# Patient Record
Sex: Female | Born: 1980 | Race: White | Hispanic: No | Marital: Married | Smoking: Never smoker
Health system: Southern US, Community
[De-identification: ages and names within clinical notes are randomized; demographics above are authoritative.]

## PROBLEM LIST (undated history)

## (undated) DIAGNOSIS — K219 Gastro-esophageal reflux disease without esophagitis: Secondary | ICD-10-CM

## (undated) DIAGNOSIS — E785 Hyperlipidemia, unspecified: Secondary | ICD-10-CM

## (undated) DIAGNOSIS — G43909 Migraine, unspecified, not intractable, without status migrainosus: Secondary | ICD-10-CM

## (undated) DIAGNOSIS — Z8739 Personal history of other diseases of the musculoskeletal system and connective tissue: Secondary | ICD-10-CM

## (undated) DIAGNOSIS — E039 Hypothyroidism, unspecified: Secondary | ICD-10-CM

## (undated) HISTORY — DX: Hypothyroidism, unspecified: E03.9

## (undated) HISTORY — DX: Migraine, unspecified, not intractable, without status migrainosus: G43.909

## (undated) HISTORY — DX: Hyperlipidemia, unspecified: E78.5

## (undated) HISTORY — PX: TONSILLECTOMY: SUR1361

## (undated) HISTORY — DX: Personal history of other diseases of the musculoskeletal system and connective tissue: Z87.39

## (undated) HISTORY — DX: Gastro-esophageal reflux disease without esophagitis: K21.9

---

## 2005-11-18 DIAGNOSIS — E039 Hypothyroidism, unspecified: Secondary | ICD-10-CM

## 2005-11-18 HISTORY — DX: Hypothyroidism, unspecified: E03.9

## 2008-01-28 ENCOUNTER — Emergency Department (HOSPITAL_COMMUNITY): Admission: EM | Admit: 2008-01-28 | Discharge: 2008-01-28 | Payer: Self-pay | Admitting: Emergency Medicine

## 2011-08-12 LAB — BASIC METABOLIC PANEL
BUN: 12
CO2: 26
Chloride: 105
Creatinine, Ser: 0.69
Glucose, Bld: 91
Potassium: 4.1

## 2011-08-12 LAB — T4: T4, Total: 13.8 — ABNORMAL HIGH

## 2018-10-30 ENCOUNTER — Ambulatory Visit: Payer: BC Managed Care – PPO | Admitting: Family Medicine

## 2018-10-30 ENCOUNTER — Encounter: Payer: Self-pay | Admitting: Family Medicine

## 2018-10-30 VITALS — BP 104/76 | HR 62 | Temp 98.2°F | Ht 64.0 in | Wt 179.0 lb

## 2018-10-30 DIAGNOSIS — Z8669 Personal history of other diseases of the nervous system and sense organs: Secondary | ICD-10-CM

## 2018-10-30 DIAGNOSIS — Z131 Encounter for screening for diabetes mellitus: Secondary | ICD-10-CM | POA: Diagnosis not present

## 2018-10-30 DIAGNOSIS — E785 Hyperlipidemia, unspecified: Secondary | ICD-10-CM | POA: Diagnosis not present

## 2018-10-30 DIAGNOSIS — E039 Hypothyroidism, unspecified: Secondary | ICD-10-CM

## 2018-10-30 DIAGNOSIS — K219 Gastro-esophageal reflux disease without esophagitis: Secondary | ICD-10-CM

## 2018-10-30 DIAGNOSIS — Z0000 Encounter for general adult medical examination without abnormal findings: Secondary | ICD-10-CM | POA: Diagnosis not present

## 2018-10-30 DIAGNOSIS — M349 Systemic sclerosis, unspecified: Secondary | ICD-10-CM

## 2018-10-30 LAB — COMPREHENSIVE METABOLIC PANEL
ALT: 12 U/L (ref 0–35)
AST: 14 U/L (ref 0–37)
Albumin: 4.6 g/dL (ref 3.5–5.2)
Alkaline Phosphatase: 40 U/L (ref 39–117)
BUN: 10 mg/dL (ref 6–23)
CO2: 25 mEq/L (ref 19–32)
Calcium: 9.5 mg/dL (ref 8.4–10.5)
Chloride: 104 mEq/L (ref 96–112)
Creatinine, Ser: 0.72 mg/dL (ref 0.40–1.20)
GFR: 97.79 mL/min (ref >60.00)
GLUCOSE: 83 mg/dL (ref 70–99)
POTASSIUM: 4.2 meq/L (ref 3.5–5.1)
Sodium: 139 mEq/L (ref 135–145)
TOTAL PROTEIN: 7.1 g/dL (ref 6.0–8.3)
Total Bilirubin: 0.5 mg/dL (ref 0.2–1.2)

## 2018-10-30 LAB — CBC WITH DIFFERENTIAL/PLATELET
BASOS PCT: 0.4 % (ref 0.0–3.0)
Basophils Absolute: 0 10*3/uL (ref 0.0–0.1)
Eosinophils Absolute: 0.1 10*3/uL (ref 0.0–0.7)
Eosinophils Relative: 2.6 % (ref 0.0–5.0)
HEMATOCRIT: 45.7 % (ref 36.0–46.0)
Hemoglobin: 15.7 g/dL — ABNORMAL HIGH (ref 12.0–15.0)
LYMPHS PCT: 28.9 % (ref 12.0–46.0)
Lymphs Abs: 1.7 10*3/uL (ref 0.7–4.0)
MCHC: 34.3 g/dL (ref 30.0–36.0)
MCV: 91 fl (ref 78.0–100.0)
MONOS PCT: 8.3 % (ref 3.0–12.0)
Monocytes Absolute: 0.5 10*3/uL (ref 0.1–1.0)
NEUTROS ABS: 3.4 10*3/uL (ref 1.4–7.7)
Neutrophils Relative %: 59.8 % (ref 43.0–77.0)
Platelets: 288 10*3/uL (ref 150.0–400.0)
RBC: 5.02 Mil/uL (ref 3.87–5.11)
RDW: 13 % (ref 11.5–15.5)
WBC: 5.7 10*3/uL (ref 4.0–10.5)

## 2018-10-30 LAB — LIPID PANEL
CHOL/HDL RATIO: 3
Cholesterol: 189 mg/dL (ref 0–200)
HDL: 54 mg/dL (ref >39.00)
LDL Cholesterol: 119 mg/dL — ABNORMAL HIGH (ref 0–99)
NONHDL: 134.79
Triglycerides: 81 mg/dL (ref 0.0–149.0)
VLDL: 16.2 mg/dL (ref 0.0–40.0)

## 2018-10-30 LAB — T4, FREE: Free T4: 0.68 ng/dL (ref 0.60–1.60)

## 2018-10-30 LAB — C-REACTIVE PROTEIN: CRP: 0.2 mg/dL — AB (ref 0.5–20.0)

## 2018-10-30 LAB — TSH: TSH: 2.85 u[IU]/mL (ref 0.35–4.50)

## 2018-10-30 LAB — SEDIMENTATION RATE: Sed Rate: 9 mm/hr (ref 0–20)

## 2018-10-30 LAB — HEMOGLOBIN A1C: Hgb A1c MFr Bld: 5.3 % (ref 4.6–6.5)

## 2018-10-30 NOTE — Progress Notes (Signed)
Subjective:     Kamiryn Bezanson is a 37 y.o. female and is here for a comprehensive physical exam. The patient reports GERDPt previously seen by Dr. Emerson Monte in [place].  GERD: -Patient endorses heaviness in diaphragm -Mom has history of reflux -Taking Gaviscon and Tagamet x3 days -Requesting GI referral  Migraines: -Endorses headache every few weeks if does not eat breakfast -Headache will start in the evening with pain behind her eyes -Will take generic Excedrin migraine  Scleroderma: -Followed by Dr. Hortencia Pilar at [facility] rheumatology -Not currently on medication -In the past was on prednisone which caused depression, edema, moon face ease, overall sick feeling  Hypothyroidism: -Taking levothyroxine 50 mcg -Denies changes in weight, hair loss, constipation, diarrhea, palpitations  Allergies: Erythromycin-nausea and cramps Prednisone-depression, edema/moon face ease, overall sick feeling  Past surgical history: Tonsillectomy 1991  Social history: Pt is a newlywed.  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  She has a PhD in [place] geared towards scientific writing.   Pt denies alcohol, tobacco, drug use.  Pt is a vegetarian.  In the past pt went vegan and think she might go back to that.  Healthcare maintenance: Dentist-Dr. Mohammed Kindle, [facility] Rheumatologist-Dr. Randall Hiss at [facility] 10/30/2018 Last Pap 2019  Family medical history: Mom-HLD, miscarriage Dad-HTN MGM-diabetes, MI, heart disease, HLD, HTN MGF-MI, heart disease, HTN PGM-ovarian/cervical cancer, early death PGF-depression Social History   Socioeconomic History  . Marital status: Married    Spouse name: Not on file  . Number of children: Not on file  . Years of education: Not on file  . Highest education level: Not on file  Occupational History  . Not on file  Social Needs  . Financial resource strain: Not on  file  . Food insecurity:    Worry: Not on file    Inability: Not on file  . Transportation needs:    Medical: Not on file    Non-medical: Not on file  Tobacco Use  . Smoking status: Never Smoker  . Smokeless tobacco: Never Used  Substance and Sexual Activity  . Alcohol use: Never    Frequency: Never  . Drug use: Never  . Sexual activity: Yes  Lifestyle  . Physical activity:    Days per week: Not on file    Minutes per session: Not on file  . Stress: Not on file  Relationships  . Social connections:    Talks on phone: Not on file    Gets together: Not on file    Attends religious service: Not on file    Active member of club or organization: Not on file    Attends meetings of clubs or organizations: Not on file    Relationship status: Not on file  . Intimate partner violence:    Fear of current or ex partner: Not on file    Emotionally abused: Not on file    Physically abused: Not on file    Forced sexual activity: Not on file  Other Topics Concern  . Not on file  Social History Narrative  . Not on file   Health Maintenance  Topic Date Due  . HIV Screening  07/04/1998  . TETANUS/TDAP  07/04/2002  . PAP SMEAR-Modifier  07/04/2004  . INFLUENZA VACCINE  06/18/2018    The following portions of the patient's history were reviewed and updated as appropriate: allergies, current medications, past family history, past medical history, past social history, past surgical  history and problem list.  Review of Systems Pertinent items noted in HPI and remainder of comprehensive ROS otherwise negative.   Objective:    BP 104/76 (BP Location: Left Arm, Patient Position: Sitting, Cuff Size: Normal)   Pulse 62   Temp 98.2 F (36.8 C) (Oral)   Ht 5\' 4"  (1.626 m)   Wt 179 lb (81.2 kg)   SpO2 95%   BMI 30.73 kg/m  General appearance: alert, cooperative and appears stated age, in NAD Head: Normocephalic, without obvious abnormality, atraumatic Eyes: conjunctivae/corneas clear.  PERRL, EOM's intact. Fundi benign. Ears: normal TM's and external ear canals both ears Nose: Nares normal. Septum midline. Mucosa normal. No drainage or sinus tenderness. Throat: lips, mucosa, and tongue normal; teeth and gums normal Neck: no adenopathy, no carotid bruit, no JVD, supple, symmetrical, trachea midline and thyroid not enlarged, symmetric, no tenderness/mass/nodules Lungs: clear to auscultation bilaterally Heart: regular rate and rhythm, S1, S2 normal, no murmur, click, rub or gallop Abdomen: soft, non-tender; bowel sounds normal; no masses,  no organomegaly Extremities: extremities normal, atraumatic, no cyanosis or edema Skin: Skin color, texture, turgor normal. No rashes or lesions Neurologic: Alert and oriented X 3, normal strength and tone. Normal symmetric reflexes. Normal coordination and gait    Assessment:    Healthy female exam with GERD.     Plan:     Anticipatory guidance given including wearing seatbelts, smoke detectors in the home, increasing physical activity, increasing p.o. intake of water and vegetables. -will obtain labs -Immunizations up-to-date -Pap up-to-date -given handout -next CPE in 1 yr See After Visit Summary for Counseling Recommendations    Scleroderma -stable -will obtain labs as requested by pt's Rheumatologist -continue f/u with Rheumatologist, Dr. Frederik SchmidtKeenan  HLD: -will obtain lipid panel  GERD -Discussed avoiding foods known to cause problems -Given handout -Continue Tagamet -Referral for GI placed  Hypothyroidism: -Continue levothyroxine 50 mcg daily -We will obtain TSH and free T4  History of migraines -Discussed headache prevention -Encouraged to eat several small meals per day -Okay to use generic Excedrin as needed  Follow-up PRN  Abbe AmsterdamShannon Jaydyn Menon, MD

## 2018-10-30 NOTE — Patient Instructions (Signed)
Preventive Care 18-39 Years, Female Preventive care refers to lifestyle choices and visits with your health care provider that can promote health and wellness. What does preventive care include?  A yearly physical exam. This is also called an annual well check.  Dental exams once or twice a year.  Routine eye exams. Ask your health care provider how often you should have your eyes checked.  Personal lifestyle choices, including: ? Daily care of your teeth and gums. ? Regular physical activity. ? Eating a healthy diet. ? Avoiding tobacco and drug use. ? Limiting alcohol use. ? Practicing safe sex. ? Taking vitamin and mineral supplements as recommended by your health care provider. What happens during an annual well check? The services and screenings done by your health care provider during your annual well check will depend on your age, overall health, lifestyle risk factors, and family history of disease. Counseling Your health care provider may ask you questions about your:  Alcohol use.  Tobacco use.  Drug use.  Emotional well-being.  Home and relationship well-being.  Sexual activity.  Eating habits.  Work and work Statistician.  Method of birth control.  Menstrual cycle.  Pregnancy history.  Screening You may have the following tests or measurements:  Height, weight, and BMI.  Diabetes screening. This is done by checking your blood sugar (glucose) after you have not eaten for a while (fasting).  Blood pressure.  Lipid and cholesterol levels. These may be checked every 5 years starting at age 66.  Skin check.  Hepatitis C blood test.  Hepatitis B blood test.  Sexually transmitted disease (STD) testing.  BRCA-related cancer screening. This may be done if you have a family history of breast, ovarian, tubal, or peritoneal cancers.  Pelvic exam and Pap test. This may be done every 3 years starting at age 40. Starting at age 59, this may be done every 5  years if you have a Pap test in combination with an HPV test.  Discuss your test results, treatment options, and if necessary, the need for more tests with your health care provider. Vaccines Your health care provider may recommend certain vaccines, such as:  Influenza vaccine. This is recommended every year.  Tetanus, diphtheria, and acellular pertussis (Tdap, Td) vaccine. You may need a Td booster every 10 years.  Varicella vaccine. You may need this if you have not been vaccinated.  HPV vaccine. If you are 69 or younger, you may need three doses over 6 months.  Measles, mumps, and rubella (MMR) vaccine. You may need at least one dose of MMR. You may also need a second dose.  Pneumococcal 13-valent conjugate (PCV13) vaccine. You may need this if you have certain conditions and were not previously vaccinated.  Pneumococcal polysaccharide (PPSV23) vaccine. You may need one or two doses if you smoke cigarettes or if you have certain conditions.  Meningococcal vaccine. One dose is recommended if you are age 27-21 years and a first-year college student living in a residence hall, or if you have one of several medical conditions. You may also need additional booster doses.  Hepatitis A vaccine. You may need this if you have certain conditions or if you travel or work in places where you may be exposed to hepatitis A.  Hepatitis B vaccine. You may need this if you have certain conditions or if you travel or work in places where you may be exposed to hepatitis B.  Haemophilus influenzae type b (Hib) vaccine. You may need this if  you have certain risk factors.  Talk to your health care provider about which screenings and vaccines you need and how often you need them. This information is not intended to replace advice given to you by your health care provider. Make sure you discuss any questions you have with your health care provider. Document Released: 12/31/2001 Document Revised: 07/24/2016  Document Reviewed: 09/05/2015 Elsevier Interactive Patient Education  2018 [name]. Scleroderma Scleroderma is a rare and long-term (chronic) disease of your immune system. Your immune system is the part of your body that protects you from getting sick. If you have scleroderma, your immune system may attack your skin and other parts of your body instead (autoimmune disease). Scleroderma means hardening of the skin. If you have a mild form, it may affect only your skin (localized scleroderma). Scleroderma can also affect your blood vessels, lungs, kidneys, heart, and digestive system (systemic scleroderma). What are the causes? The cause of scleroderma is not known. What increases the risk? You may be at higher risk for scleroderma if:  You have a family history of scleroderma.  You have another autoimmune disease, especially lupus.  You are female.  You are 35-78 years old.  What are the signs or symptoms? Signs and symptoms of scleroderma depend on the type of scleroderma you have and vary from person to person. Exposure to cold often triggers symptoms of the condition. Early symptoms may include:  Discoloration of the fingers and sometimes toes. Your fingers or toes may turn blue, white, or red.  Numbness or pain in your fingers or toes.  Sores and loss of blood supply (gangrene) in the fingers and toes.  Signs and symptoms of localized scleroderma include:  Discolored patches of skin (morphea). These may be thick and waxy.  Bands of thick, hard skin on your arms, legs, or face (linear scleroderma).  Signs and symptoms of systemic scleroderma include:  Trouble swallowing (dysphagia).  Enlarged blood vessels of the hands, face, and nail beds (telangiectasias).  Calcium deposits under the skin (calcinosis).  High blood pressure.  Heartburn.  Constipation.  Trouble breathing.  Joint pain.  Fatigue and weakness.  How is this diagnosed? Your health care  provider will make a diagnosis based on your symptoms and medical history. Your health care provider will also do a physical exam. This may include tests, such as:  Blood tests.  X-rays.  Lung (pulmonary) function tests.  Scleroderma can be hard to diagnose because other diseases have many of the same symptoms. You may need to see further specialists as directed by your health care provider. How is this treated? There is no known cure for scleroderma. Treatment focuses on relieving symptoms and preventing complications. This may include taking medicine to:  Improve blood flow.  Block production of stomach acid to treat heartburn.  Treat high blood pressure caused by kidney disease.  Relieve joint pain and inflammation.  Treat lung symptoms.  Mild symptoms may not need treatment. Follow these instructions at home:  Learn as much as you can about scleroderma and work closely with your team of health care providers.  Take medicines only as directed by your health care provider.  Do not use any tobacco products including cigarettes, chewing tobacco, or electronic cigarettes. If you need help quitting, ask your health care provider.  Keep your hands and feet warm and protected.  Dress in layers for the cold.  Wear comfortable shoes that are well cushioned.  Stretch and exercise regularly.  Maintain a healthy  weight.  Eat a healthy diet.  Eat smaller meals often.  Avoid spicy and fatty foods.  Do not eat meals late in the evening.  Protect your skin with sunscreen and moisturizers.  Keep the head of your bed slightly elevated.  Ask your health care provider how to check your blood pressure at home and check it as directed by your health care provider.  Make sure you have a good support system at home. Contact a health care provider if:  Your scleroderma symptoms change or become worse.  You feel short of breath.  You have trouble swallowing.  You are  struggling with depression or anxiety. Get help right away if:  You have trouble breathing.  You have chest pain.  A finger or toe becomes painful or numb.  A finger or toe turns a very dark color. This information is not intended to replace advice given to you by your health care provider. Make sure you discuss any questions you have with your health care provider. Document Released: 01/25/2003 Document Revised: 04/11/2016 Document Reviewed: 01/07/2014 Elsevier Interactive Patient Education  2018 [employer].  Hypothyroidism Hypothyroidism is a disorder of the thyroid. The thyroid is a large gland that is located in the lower front of the neck. The thyroid releases hormones that control how the body works. With hypothyroidism, the thyroid does not make enough of these hormones. What are the causes? Causes of hypothyroidism may include:  Viral infections.  Pregnancy.  Your own defense system (immune system) attacking your thyroid.  Certain medicines.  Birth defects.  Past radiation treatments to your head or neck.  Past treatment with radioactive iodine.  Past surgical removal of part or all of your thyroid.  Problems with the gland that is located in the center of your brain (pituitary).  What are the signs or symptoms? Signs and symptoms of hypothyroidism may include:  Feeling as though you have no energy (lethargy).  Inability to tolerate cold.  Weight gain that is not explained by a change in diet or exercise habits.  Dry skin.  Coarse hair.  Menstrual irregularity.  Slowing of thought processes.  Constipation.  Sadness or depression.  How is this diagnosed? Your health care provider may diagnose hypothyroidism with blood tests and ultrasound tests. How is this treated? Hypothyroidism is treated with medicine that replaces the hormones that your body does not make. After you begin treatment, it may take several weeks for symptoms to go away. Follow  these instructions at home:  Take medicines only as directed by your health care provider.  If you start taking any new medicines, tell your health care provider.  Keep all follow-up visits as directed by your health care provider. This is important. As your condition improves, your dosage needs may change. You will need to have blood tests regularly so that your health care provider can watch your condition. Contact a health care provider if:  Your symptoms do not get better with treatment.  You are taking thyroid replacement medicine and: ? You sweat excessively. ? You have tremors. ? You feel anxious. ? You lose weight rapidly. ? You cannot tolerate heat. ? You have emotional swings. ? You have diarrhea. ? You feel weak. Get help right away if:  You develop chest pain.  You develop an irregular heartbeat.  You develop a rapid heartbeat. This information is not intended to replace advice given to you by your health care provider. Make sure you discuss any questions you have with  your health care provider. Document Released: 11/04/2005 Document Revised: 04/11/2016 Document Reviewed: 03/22/2014 Elsevier Interactive Patient Education  2018 [employer].

## 2018-11-26 ENCOUNTER — Encounter: Payer: Self-pay | Admitting: Gastroenterology

## 2018-11-26 ENCOUNTER — Ambulatory Visit: Payer: BC Managed Care – PPO | Admitting: Gastroenterology

## 2018-11-26 VITALS — BP 104/64 | HR 72 | Ht 64.5 in | Wt 176.0 lb

## 2018-11-26 DIAGNOSIS — R12 Heartburn: Secondary | ICD-10-CM

## 2018-11-26 NOTE — Progress Notes (Signed)
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  She had about 10 days of fairly constant heartburn regurgitation occurring in early December leading up to a visit with primary care on December 13.  She not had a severe episode like that in the past, though she would previously get occasional heartburn.  She denied dysphagia before that and does not have any now.  Her appetite is been good and weight stable with no odynophagia nausea or vomiting. Tagamet twice daily was started, symptoms started decreasing and have now resolved while she is still taking it. Kari Hood was diagnosed with scleroderma years ago and was under treatment for years with CellCept [facility]  She tells me that her symptoms were primarily ray nodes and hand/forearm stiffness.  All that improved considerably on the years of treatment, and then she was able to stop it and is now felt to be in remission.  She did not have heartburn or dysphagia during that time.    ROS:  Review of Systems  Constitutional: Negative for appetite change and unexpected weight change.  HENT: Negative for mouth sores and voice change.   Eyes: Negative for pain and redness.  Respiratory: Negative for cough and shortness of breath.   Cardiovascular: Negative for chest pain and palpitations.  Genitourinary: Negative for dysuria and hematuria.  Musculoskeletal: Negative for arthralgias and myalgias.  Skin: Negative for pallor and rash.  Neurological: Negative for weakness and headaches.    Hematological: Negative for adenopathy.   She still gets Raynaud's phenomenon with cold exposure  Past Medical History: Past Medical History:  Diagnosis Date  . GERD (gastroesophageal reflux disease)   . History of scleroderma    [facility]  . Hyperlipidemia   . Hypothyroidism   . Migraine      Past Surgical History: Past Surgical History:  Procedure Laterality Date  . TONSILLECTOMY       Family History: Family History  Problem Relation Age of Onset  . Hyperlipidemia Mother   . Hypertension Father   . Diabetes Maternal Grandmother   . Heart disease Maternal Grandmother   . Hyperlipidemia Maternal Grandmother   . Hypertension Maternal Grandmother   . Cancer Paternal Grandmother   . Early death Paternal Grandmother   . Depression Paternal Grandfather   . Colon cancer Neg Hx   . Esophageal cancer Neg Hx   . Rectal cancer Neg Hx     Social History: Social History   Socioeconomic History  . Marital status: Married    Spouse name: Kari SilversmithJoshua Hood  . Number of children: 0  . Years of education: Not on file  . Highest education level: Not on file  Occupational History  . Occupation: professor at [employer]  Social Needs  . Financial resource strain: Not on file  . Food insecurity:    Worry: Not on file    Inability: Not on file  . Transportation needs:    Medical: Not on file    Non-medical: Not on file  Tobacco Use  . Smoking  status: Never Smoker  . Smokeless tobacco: Never Used  Substance and Sexual Activity  . Alcohol use: Never    Frequency: Never  . Drug use: Never  . Sexual activity: Yes  Lifestyle  . Physical activity:    Days per week: Not on file    Minutes per session: Not on file  . Stress: Not on file  Relationships  . Social connections:    Talks on phone: Not on file    Gets together: Not on file    Attends religious service: Not on file    Active member of club or organization: Not on file     Attends meetings of clubs or organizations: Not on file    Relationship status: Not on file  Other Topics Concern  . Not on file  Social History Narrative  . 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 Erythromycin     edema  . Prednisone     Edema depressed    Outpatient Meds: Current Outpatient Medications  Medication Sig Dispense Refill  . cimetidine (TAGAMET HB) 200 MG tablet Take 200 mg by mouth daily as needed.    Marland Kitchen levothyroxine (SYNTHROID, LEVOTHROID) 50 MCG tablet Take 50 mcg by mouth daily before breakfast.     No current facility-administered medications for this visit.       ___________________________________________________________________ Objective   Exam:  BP 104/64   Pulse 72   Ht 5' 4.5" (1.638 m)   Wt 176 lb (79.8 kg)   BMI 29.74 kg/m    General: this is a(n) well-appearing woman without facial changes of scleroderma.  Vocal quality normal.  Eyes: sclera anicteric, no redness  ENT: oral mucosa moist without lesions, no cervical or supraclavicular lymphadenopathy  CV: RRR without murmur, S1/S2, no JVD, no peripheral edema  Resp: clear to auscultation bilaterally, normal RR and effort noted  GI: soft, no tenderness, with active bowel sounds. No guarding or palpable organomegaly noted.  Skin; warm and dry, no rash or jaundice noted.  She has scleroderma changes of fingers  Neuro: awake, alert and oriented x 3. Normal gross motor function and fluent speech  Labs:  CBC Latest Ref Rng & Units 10/30/2018  WBC 4.0 - 10.5 K/uL 5.7  Hemoglobin 12.0 - 15.0 g/dL 15.7(H)  Hematocrit 36.0 - 46.0 % 45.7  Platelets 150.0 - 400.0 K/uL 288.0   CMP Latest Ref Rng & Units 10/30/2018 01/28/2008  Glucose 70 - 99 mg/dL 83 91  BUN 6 - 23 mg/dL 10 12  Creatinine 1.61 - 1.20 mg/dL 0.96 0.45  Sodium 409 - 145 mEq/L 139 140  Potassium 3.5 - 5.1 mEq/L 4.2 4.1  Chloride  96 - 112 mEq/L 104 105  CO2 19 - 32 mEq/L 25 26  Calcium 8.4 - 10.5 mg/dL 9.5 8.9  Total Protein 6.0 - 8.3 g/dL 7.1 -  Total Bilirubin 0.2 - 1.2 mg/dL 0.5 -  Alkaline Phos 39 - 117 U/L 40 -  AST 0 - 37 U/L 14 -  ALT 0 - 35 U/L 12 -     Assessment: Encounter Diagnosis  Name Primary?  . Heartburn Yes    Recent episode of severe heartburn that got under control with twice daily H2 blocker.  No red flag symptoms, and this is not something that has troubled her the past to this degree. She was already aware that scleroderma can affect the upper digestive tract,  but I do not think the reflux episode she experienced was likely related to scleroderma.  Plan:   I advised her to wean off the Tagamet by decreasing to once daily over the next week, then every other day for a week, then stop.  If symptoms recur, and certainly if they escalate near the point they were before, then I asked her to contact me.  If that occurs, then I would advise her to resume the medication and we will discuss an upper endoscopy to look for erosive esophagitis or chronic changes of reflux.  Thank you for the courtesy of this consult.  Please call me with any questions or concerns.  Charlie Pitter III  CC: Referring provider noted above

## 2018-11-26 NOTE — Patient Instructions (Signed)
If you are age 36 or older, your body mass index should be between 23-30. Your Body mass index is 29.74 kg/m. If this is out of the aforementioned range listed, please consider follow up with your Primary Care Provider.  If you are age 36 or younger, your body mass index should be between 19-25. Your Body mass index is 29.74 kg/m. If this is out of the aformentioned range listed, please consider follow up with your Primary Care Provider.    Gastroesophageal Reflux Disease, Adult Gastroesophageal reflux (GER) happens when acid from the stomach flows up into the tube that connects the mouth and the stomach (esophagus). Normally, food travels down the esophagus and stays in the stomach to be digested. With GER, food and stomach acid sometimes move back up into the esophagus. You may have a disease called gastroesophageal reflux disease (GERD) if the reflux:  Happens often.  Causes frequent or very bad symptoms.  Causes problems such as damage to the esophagus. When this happens, the esophagus becomes sore and swollen (inflamed). Over time, GERD can make small holes (ulcers) in the lining of the esophagus. What are the causes? This condition is caused by a problem with the muscle between the esophagus and the stomach. When this muscle is weak or not normal, it does not close properly to keep food and acid from coming back up from the stomach. The muscle can be weak because of:  Tobacco use.  Pregnancy.  Having a certain type of hernia (hiatal hernia).  Alcohol use.  Certain foods and drinks, such as coffee, chocolate, onions, and peppermint. What increases the risk? You are more likely to develop this condition if you:  Are overweight.  Have a disease that affects your connective tissue.  Use NSAID medicines. What are the signs or symptoms? Symptoms of this condition include:  Heartburn.  Difficult or painful swallowing.  The feeling of having a lump in the throat.  A bitter  taste in the mouth.  Bad breath.  Having a lot of saliva.  Having an upset or bloated stomach.  Belching.  Chest pain. Different conditions can cause chest pain. Make sure you see your doctor if you have chest pain.  Shortness of breath or noisy breathing (wheezing).  Ongoing (chronic) cough or a cough at night.  Wearing away of the surface of teeth (tooth enamel).  Weight loss. How is this treated? Treatment will depend on how bad your symptoms are. Your doctor may suggest:  Changes to your diet.  Medicine.  Surgery. Follow these instructions at home: Eating and drinking   Follow a diet as told by your doctor. You may need to avoid foods and drinks such as: ? Coffee and tea (with or without caffeine). ? Drinks that contain alcohol. ? Energy drinks and sports drinks. ? Bubbly (carbonated) drinks or sodas. ? Chocolate and cocoa. ? Peppermint and mint flavorings. ? Garlic and onions. ? Horseradish. ? Spicy and acidic foods. These include peppers, chili powder, curry powder, vinegar, hot sauces, and BBQ sauce. ? Citrus fruit juices and citrus fruits, such as oranges, lemons, and limes. ? Tomato-based foods. These include red sauce, chili, salsa, and pizza with red sauce. ? Fried and fatty foods. These include donuts, french fries, potato chips, and high-fat dressings. ? High-fat meats. These include hot dogs, rib eye steak, sausage, ham, and bacon. ? High-fat dairy items, such as whole milk, butter, and cream cheese.  Eat small meals often. Avoid eating large meals.  Avoid drinking  large amounts of liquid with your meals.  Avoid eating meals during the 2-3 hours before bedtime.  Avoid lying down right after you eat.  Do not exercise right after you eat. Lifestyle   Do not use any products that contain nicotine or tobacco. These include cigarettes, e-cigarettes, and chewing tobacco. If you need help quitting, ask your doctor.  Try to lower your stress. If you  need help doing this, ask your doctor.  If you are overweight, lose an amount of weight that is healthy for you. Ask your doctor about a safe weight loss goal. General instructions  Pay attention to any changes in your symptoms.  Take over-the-counter and prescription medicines only as told by your doctor. Do not take aspirin, ibuprofen, or other NSAIDs unless your doctor says it is okay.  Wear loose clothes. Do not wear anything tight around your waist.  Raise (elevate) the head of your bed about 6 inches (15 cm).  Avoid bending over if this makes your symptoms worse.  Keep all follow-up visits as told by your doctor. This is important. Contact a doctor if:  You have new symptoms.  You lose weight and you do not know why.  You have trouble swallowing or it hurts to swallow.  You have wheezing or a cough that keeps happening.  Your symptoms do not get better with treatment.  You have a hoarse voice. Get help right away if:  You have pain in your arms, neck, jaw, teeth, or back.  You feel sweaty, dizzy, or light-headed.  You have chest pain or shortness of breath.  You throw up (vomit) and your throw-up looks like blood or coffee grounds.  You pass out (faint).  Your poop (stool) is bloody or black.  You cannot swallow, drink, or eat. Summary  If a person has gastroesophageal reflux disease (GERD), food and stomach acid move back up into the esophagus and cause symptoms or problems such as damage to the esophagus.  Treatment will depend on how bad your symptoms are.  Follow a diet as told by your doctor.  Take all medicines only as told by your doctor. This information is not intended to replace advice given to you by your health care provider. Make sure you discuss any questions you have with your health care provider. Document Released: 04/22/2008 Document Revised: 05/13/2018 Document Reviewed: 05/13/2018 Elsevier Interactive Patient Education  2019 [employer].  Food Choices for Gastroesophageal Reflux Disease, Adult When you have gastroesophageal reflux disease (GERD), the foods you eat and your eating habits are very important. Choosing the right foods can help ease your discomfort. Think about working with a nutrition specialist (dietitian) to help you make good choices. What are tips for following this plan?  Meals  Choose healthy foods that are low in fat, such as fruits, vegetables, whole grains, low-fat dairy products, and lean meat, fish, and poultry.  Eat small meals often instead of 3 large meals a day. Eat your meals slowly, and in a place where you are relaxed. Avoid bending over or lying down until 2-3 hours after eating.  Avoid eating meals 2-3 hours before bed.  Avoid drinking a lot of liquid with meals.  Cook foods using methods other than frying. Bake, grill, or broil food instead.  Avoid or limit: ? Chocolate. ? Peppermint or spearmint. ? Alcohol. ? Pepper. ? Black and decaffeinated coffee. ? Black and decaffeinated tea. ? Bubbly (carbonated) soft drinks. ? Caffeinated energy drinks and soft drinks.  Limit high-fat foods such as: ? Fatty meat or fried foods. ? Whole milk, cream, butter, or ice cream. ? Nuts and nut butters. ? Pastries, donuts, and sweets made with butter or shortening.  Avoid foods that cause symptoms. These foods may be different for everyone. Common foods that cause symptoms include: ? Tomatoes. ? Oranges, lemons, and limes. ? Peppers. ? Spicy food. ? Onions and garlic. ? Vinegar. Lifestyle  Maintain a healthy weight. Ask your doctor what weight is healthy for you. If you need to lose weight, work with your doctor to do so safely.  Exercise for at least 30 minutes for 5 or more days each week, or as told by your doctor.  Wear loose-fitting clothes.  Do not smoke. If you need help quitting, ask your doctor.  Sleep with the head of your bed higher than your feet. Use a wedge under  the mattress or blocks under the bed frame to raise the head of the bed. Summary  When you have gastroesophageal reflux disease (GERD), food and lifestyle choices are very important in easing your symptoms.  Eat small meals often instead of 3 large meals a day. Eat your meals slowly, and in a place where you are relaxed.  Limit high-fat foods such as fatty meat or fried foods.  Avoid bending over or lying down until 2-3 hours after eating.  Avoid peppermint and spearmint, caffeine, alcohol, and chocolate. This information is not intended to replace advice given to you by your health care provider. Make sure you discuss any questions you have with your health care provider. Document Released: 05/05/2012 Document Revised: 12/10/2016 Document Reviewed: 12/10/2016 Elsevier Interactive Patient Education  [employer]2019 [employer] Inc.  It was a pleasure to see you today!  Dr. Myrtie Neitheranis

## 2019-08-06 ENCOUNTER — Other Ambulatory Visit: Payer: Self-pay

## 2019-08-06 DIAGNOSIS — Z20822 Contact with and (suspected) exposure to covid-19: Secondary | ICD-10-CM

## 2019-08-07 LAB — NOVEL CORONAVIRUS, NAA: SARS-CoV-2, NAA: NOT DETECTED

## 2020-04-21 ENCOUNTER — Encounter: Payer: BC Managed Care – PPO | Admitting: Family Medicine

## 2020-04-27 ENCOUNTER — Other Ambulatory Visit: Payer: Self-pay

## 2020-04-28 ENCOUNTER — Ambulatory Visit (INDEPENDENT_AMBULATORY_CARE_PROVIDER_SITE_OTHER): Payer: BC Managed Care – PPO | Admitting: Family Medicine

## 2020-04-28 ENCOUNTER — Encounter: Payer: Self-pay | Admitting: Family Medicine

## 2020-04-28 VITALS — BP 98/72 | HR 68 | Temp 97.7°F | Wt 178.8 lb

## 2020-04-28 DIAGNOSIS — L84 Corns and callosities: Secondary | ICD-10-CM | POA: Diagnosis not present

## 2020-04-28 DIAGNOSIS — Z0000 Encounter for general adult medical examination without abnormal findings: Secondary | ICD-10-CM

## 2020-04-28 DIAGNOSIS — R591 Generalized enlarged lymph nodes: Secondary | ICD-10-CM

## 2020-04-28 DIAGNOSIS — R635 Abnormal weight gain: Secondary | ICD-10-CM | POA: Diagnosis not present

## 2020-04-28 DIAGNOSIS — Z789 Other specified health status: Secondary | ICD-10-CM

## 2020-04-28 DIAGNOSIS — E039 Hypothyroidism, unspecified: Secondary | ICD-10-CM | POA: Diagnosis not present

## 2020-04-28 DIAGNOSIS — E785 Hyperlipidemia, unspecified: Secondary | ICD-10-CM | POA: Diagnosis not present

## 2020-04-28 LAB — CBC WITH DIFFERENTIAL/PLATELET
Basophils Absolute: 0.1 10*3/uL (ref 0.0–0.1)
Basophils Relative: 0.9 % (ref 0.0–3.0)
Eosinophils Absolute: 0.2 10*3/uL (ref 0.0–0.7)
Eosinophils Relative: 3.4 % (ref 0.0–5.0)
HCT: 42 % (ref 36.0–46.0)
Hemoglobin: 13.9 g/dL (ref 12.0–15.0)
Lymphocytes Relative: 30.1 % (ref 12.0–46.0)
Lymphs Abs: 2 10*3/uL (ref 0.7–4.0)
MCHC: 33.1 g/dL (ref 30.0–36.0)
MCV: 90.5 fl (ref 78.0–100.0)
Monocytes Absolute: 0.5 10*3/uL (ref 0.1–1.0)
Monocytes Relative: 7.9 % (ref 3.0–12.0)
Neutro Abs: 3.8 10*3/uL (ref 1.4–7.7)
Neutrophils Relative %: 57.7 % (ref 43.0–77.0)
Platelets: 305 10*3/uL (ref 150.0–400.0)
RBC: 4.64 Mil/uL (ref 3.87–5.11)
RDW: 13.4 % (ref 11.5–15.5)
WBC: 6.5 10*3/uL (ref 4.0–10.5)

## 2020-04-28 LAB — BASIC METABOLIC PANEL
BUN: 8 mg/dL (ref 6–23)
CO2: 27 mEq/L (ref 19–32)
Calcium: 9.1 mg/dL (ref 8.4–10.5)
Chloride: 105 mEq/L (ref 96–112)
Creatinine, Ser: 0.65 mg/dL (ref 0.40–1.20)
GFR: 102.67 mL/min (ref >60.00)
Glucose, Bld: 89 mg/dL (ref 70–99)
Potassium: 4.2 mEq/L (ref 3.5–5.1)
Sodium: 136 mEq/L (ref 135–145)

## 2020-04-28 LAB — LIPID PANEL
Cholesterol: 191 mg/dL (ref 0–200)
HDL: 45.7 mg/dL (ref >39.00)
LDL Cholesterol: 127 mg/dL — ABNORMAL HIGH (ref 0–99)
NonHDL: 145.76
Total CHOL/HDL Ratio: 4
Triglycerides: 92 mg/dL (ref 0.0–149.0)
VLDL: 18.4 mg/dL (ref 0.0–40.0)

## 2020-04-28 LAB — VITAMIN D 25 HYDROXY (VIT D DEFICIENCY, FRACTURES): VITD: 34.54 ng/mL (ref 30.00–100.00)

## 2020-04-28 LAB — TSH: TSH: 2.25 u[IU]/mL (ref 0.35–4.50)

## 2020-04-28 LAB — VITAMIN B12: Vitamin B-12: 928 pg/mL — ABNORMAL HIGH (ref 211–911)

## 2020-04-28 NOTE — Patient Instructions (Addendum)
Preventive Care 21-37 Years Old, Female Preventive care refers to visits with your health care provider and lifestyle choices that can promote health and wellness. This includes:  A yearly physical exam. This may also be called an annual well check.  Regular dental visits and eye exams.  Immunizations.  Screening for certain conditions.  Healthy lifestyle choices, such as eating a healthy diet, getting regular exercise, not using drugs or products that contain nicotine and tobacco, and limiting alcohol use. What can I expect for my preventive care visit? Physical exam Your health care provider will check your:  Height and weight. This may be used to calculate body mass index (BMI), which tells if you are at a healthy weight.  Heart rate and blood pressure.  Skin for abnormal spots. Counseling Your health care provider may ask you questions about your:  Alcohol, tobacco, and drug use.  Emotional well-being.  Home and relationship well-being.  Sexual activity.  Eating habits.  Work and work environment.  Method of birth control.  Menstrual cycle.  Pregnancy history. What immunizations do I need?  Influenza (flu) vaccine  This is recommended every year. Tetanus, diphtheria, and pertussis (Tdap) vaccine  You may need a Td booster every 10 years. Varicella (chickenpox) vaccine  You may need this if you have not been vaccinated. Human papillomavirus (HPV) vaccine  If recommended by your health care provider, you may need three doses over 6 months. Measles, mumps, and rubella (MMR) vaccine  You may need at least one dose of MMR. You may also need a second dose. Meningococcal conjugate (MenACWY) vaccine  One dose is recommended if you are age 19-21 years and a first-year college student living in a residence hall, or if you have one of several medical conditions. You may also need additional booster doses. Pneumococcal conjugate (PCV13) vaccine  You may need  this if you have certain conditions and were not previously vaccinated. Pneumococcal polysaccharide (PPSV23) vaccine  You may need one or two doses if you smoke cigarettes or if you have certain conditions. Hepatitis A vaccine  You may need this if you have certain conditions or if you travel or work in places where you may be exposed to hepatitis A. Hepatitis B vaccine  You may need this if you have certain conditions or if you travel or work in places where you may be exposed to hepatitis B. Haemophilus influenzae type b (Hib) vaccine  You may need this if you have certain conditions. You may receive vaccines as individual doses or as more than one vaccine together in one shot (combination vaccines). Talk with your health care provider about the risks and benefits of combination vaccines. What tests do I need?  Blood tests  Lipid and cholesterol levels. These may be checked every 5 years starting at age 20.  Hepatitis C test.  Hepatitis B test. Screening  Diabetes screening. This is done by checking your blood sugar (glucose) after you have not eaten for a while (fasting).  Sexually transmitted disease (STD) testing.  BRCA-related cancer screening. This may be done if you have a family history of breast, ovarian, tubal, or peritoneal cancers.  Pelvic exam and Pap test. This may be done every 3 years starting at age 21. Starting at age 30, this may be done every 5 years if you have a Pap test in combination with an HPV test. Talk with your health care provider about your test results, treatment options, and if necessary, the need for more tests.   Follow these instructions at home: Eating and drinking   Eat a diet that includes fresh fruits and vegetables, whole grains, lean protein, and low-fat dairy.  Take vitamin and mineral supplements as recommended by your health care provider.  Do not drink alcohol if: ? Your health care provider tells you not to drink. ? You are  pregnant, may be pregnant, or are planning to become pregnant.  If you drink alcohol: ? Limit how much you have to 0-1 drink a day. ? Be aware of how much alcohol is in your drink. In the [place] one drink equals one 12 oz bottle of beer (355 mL), one 5 oz glass of wine (148 mL), or one 1 oz glass of hard liquor (44 mL). Lifestyle  Take daily care of your teeth and gums.  Stay active. Exercise for at least 30 minutes on 5 or more days each week.  Do not use any products that contain nicotine or tobacco, such as cigarettes, e-cigarettes, and chewing tobacco. If you need help quitting, ask your health care provider.  If you are sexually active, practice safe sex. Use a condom or other form of birth control (contraception) in order to prevent pregnancy and STIs (sexually transmitted infections). If you plan to become pregnant, see your health care provider for a preconception visit. What's next?  Visit your health care provider once a year for a well check visit.  Ask your health care provider how often you should have your eyes and teeth checked.  Stay up to date on all vaccines. This information is not intended to replace advice given to you by your health care provider. Make sure you discuss any questions you have with your health care provider. Document Revised: 07/16/2018 Document Reviewed: 07/16/2018 Elsevier Patient Education  2020 Elsevier Inc.  Foot Pain Many things can cause foot pain. Some common causes are:  An injury.  A sprain.  Arthritis.  Blisters.  Bunions. Follow these instructions at home: Managing pain, stiffness, and swelling If directed, put ice on the painful area:  Put ice in a plastic bag.  Place a towel between your skin and the bag.  Leave the ice on for 20 minutes, 2-3 times a day.  Activity  Do not stand or walk for long periods.  Return to your normal activities as told by your health care provider. Ask your health care provider what  activities are safe for you.  Do stretches to relieve foot pain and stiffness as told by your health care provider.  Do not lift anything that is heavier than 10 lb (4.5 kg), or the limit that you are told, until your health care provider says that it is safe. Lifting a lot of weight can put added pressure on your feet. Lifestyle  Wear comfortable, supportive shoes that fit you well. Do not wear high heels.  Keep your feet clean and dry. General instructions  Take over-the-counter and prescription medicines only as told by your health care provider.  Rub your foot gently.  Pay attention to any changes in your symptoms.  Keep all follow-up visits as told by your health care provider. This is important. Contact a health care provider if:  Your pain does not get better after a few days of self-care.  Your pain gets worse.  You cannot stand on your foot. Get help right away if:  Your foot is numb or tingling.  Your foot or toes are swollen.  Your foot or toes turn white or blue.  You have warmth and redness along your foot. Summary  Common causes of foot pain are injury, sprain, arthritis, blisters or bunions.  Ice, medicines, and comfortable shoes may help foot pain.  Contact your health care provider if your pain does not get better after a few days of self-care. This information is not intended to replace advice given to you by your health care provider. Make sure you discuss any questions you have with your health care provider. Document Revised: 08/20/2018 Document Reviewed: 08/20/2018 Elsevier Patient Education  2020 Elsevier Inc.  Lymphadenopathy  Lymphadenopathy means that your lymph glands are swollen or larger than normal (enlarged). Lymph glands, also called lymph nodes, are collections of tissue that filter bacteria, viruses, and waste from your bloodstream. They are part of your body's disease-fighting system (immune system), which protects your body from  germs. There may be different causes of lymphadenopathy, depending on where it is in your body. Some types go away on their own. Lymphadenopathy can occur anywhere that you have lymph glands, including these areas:  Neck (cervical lymphadenopathy).  Chest (mediastinal lymphadenopathy).  Lungs (hilar lymphadenopathy).  Underarms (axillary lymphadenopathy).  Groin (inguinal lymphadenopathy). When your immune system responds to germs, infection-fighting cells and fluid build up in your lymph glands. This causes some swelling and enlargement. If the lymph glands do not go back to normal after you have an infection or disease, your health care provider may do tests. These tests help to monitor your condition and find the reason why the glands are still swollen and enlarged. Follow these instructions at home:  Get plenty of rest.  Take over-the-counter and prescription medicines only as told by your health care provider. Your health care provider may recommend over-the-counter medicines for pain.  If directed, apply heat to swollen lymph glands as often as told by your health care provider. Use the heat source that your health care provider recommends, such as a moist heat pack or a heating pad. ? Place a towel between your skin and the heat source. ? Leave the heat on for 20-30 minutes. ? Remove the heat if your skin turns bright red. This is especially important if you are unable to feel pain, heat, or cold. You may have a greater risk of getting burned.  Check your affected lymph glands every day for changes. Check other lymph gland areas as told by your health care provider. Check for changes such as: ? More swelling. ? Sudden increase in size. ? Redness or pain. ? Hardness.  Keep all follow-up visits as told by your health care provider. This is important. Contact a health care provider if you have:  Swelling that gets worse or spreads to other areas.  Problems with  breathing.  Lymph glands that: ? Are still swollen after 2 weeks. ? Have suddenly gotten bigger. ? Are red, painful, or hard.  A fever or chills.  Fatigue.  A sore throat.  Pain in your abdomen.  Weight loss.  Night sweats. Get help right away if you have:  Fluid leaking from an enlarged lymph gland.  Severe pain.  Chest pain.  Shortness of breath. Summary  Lymphadenopathy means that your lymph glands are swollen or larger than normal (enlarged).  Lymph glands (also called lymph nodes) are collections of tissue that filter bacteria, viruses, and waste from the bloodstream. They are part of your body's disease-fighting system (immune system).  Lymphadenopathy can occur anywhere that you have lymph glands.  If your enlarged and swollen lymph glands  do not go back to normal after you have an infection or disease, your health care provider may do tests to monitor your condition and find the reason why the glands are still swollen and enlarged.  Check your affected lymph glands every day for changes. Check other lymph gland areas as told by your health care provider. This information is not intended to replace advice given to you by your health care provider. Make sure you discuss any questions you have with your health care provider. Document Revised: 10/17/2017 Document Reviewed: 09/19/2017 Elsevier Patient Education  2020 [employer].

## 2020-04-28 NOTE — Progress Notes (Signed)
Subjective:     Kari Hood is a 39 y.o. female and is here for a comprehensive physical exam. The patient reports problems - L foot pain x 2 wks.  Patient notes callus plantar lateral left foot near the fifth digit.  Only painful with walking in shoes.  No TTP.  Patient also notes some weight gain 2/2 being home during Covid pandemic.  Pt notes thyroid stable specialist at [facility] states PCP can manage further.  Patient denies current symptoms.  Patient is a vegetarian.  Wonders if she is getting enough vitamins.  At times eats fish.  Often forgets to take a multivitamin.  Had pap last wk at OB/Gyn.  Social History   Socioeconomic History  . Marital status: Married    Spouse name: [name]  . Number of children: 0  . Years of education: Not on file  . Highest education level: Not on file  Occupational History  . Occupation: professor at [employer]  . Smoking status: Never Smoker  . Smokeless tobacco: Never Used  Vaping Use  . Vaping Use: Never used  Substance and Sexual Activity  . Alcohol use: Never  . Drug use: Never  . Sexual activity: Yes  Other Topics Concern  . Not on file  Social History Narrative  . Not on file   Social Determinants of Health   Financial Resource Strain:   . Difficulty of Paying Living Expenses:   Food Insecurity:   . Worried About Programme researcher, broadcasting/film/video in the Last Year:   . Barista in the Last Year:   Transportation Needs:   . Freight forwarder (Medical):   Marland Kitchen Lack of Transportation (Non-Medical):   Physical Activity:   . Days of Exercise per Week:   . Minutes of Exercise per Session:   Stress:   . Feeling of Stress :   Social Connections:   . Frequency of Communication with Friends and Family:   . Frequency of Social Gatherings with Friends and Family:   . Attends Religious Services:   . Active Member of Clubs or Organizations:   . Attends Banker Meetings:   Marland Kitchen Marital Status:    Intimate Partner Violence:   . Fear of Current or Ex-Partner:   . Emotionally Abused:   Marland Kitchen Physically Abused:   . Sexually Abused:    Health Maintenance  Topic Date Due  . Hepatitis C Screening  Never done  . COVID-19 Vaccine (1) Never done  . HIV Screening  Never done  . TETANUS/TDAP  Never done  . PAP SMEAR-Modifier  Never done  . INFLUENZA VACCINE  06/18/2020    The following portions of the patient's history were reviewed and updated as appropriate: allergies, current medications, past family history, past medical history, past social history, past surgical history and problem list.  Review of Systems Pertinent items noted in HPI and remainder of comprehensive ROS otherwise negative.   Objective:    BP 98/72 (BP Location: Left Arm, Patient Position: Sitting, Cuff Size: Normal)   Pulse 68   Temp 97.7 F (36.5 C) (Temporal)   Wt 178 lb 12.8 oz (81.1 kg)   LMP 04/20/2020 (Approximate)   SpO2 98%   BMI 30.22 kg/m  General appearance: alert, cooperative and no distress Head: Normocephalic, without obvious abnormality, atraumatic Eyes: conjunctivae/corneas clear. PERRL, EOM's intact. Fundi benign. Ears: normal TM's and external ear canals both ears  Nose: Nares normal. Septum midline. Mucosa normal.  No drainage or sinus tenderness. Throat: lips, mucosa, and tongue normal; teeth and gums normal Neck: no carotid bruit, no JVD, supple, symmetrical, trachea midline, thyroid not enlarged, symmetric, no tenderness/mass/nodules and Mild R cervical lymphadenopathy, nontender.  L supraclavicular node prominent with deep palpation, nontender Lungs: clear to auscultation bilaterally Heart: regular rate and rhythm, S1, S2 normal, no murmur, click, rub or gallop Abdomen: soft, non-tender; bowel sounds normal; no masses,  no organomegaly Extremities: extremities normal, atraumatic, no cyanosis or edema No TTP of L foot or deformity.  Callus on lateral L foot. Pulses: 2+ and  symmetric Skin: Skin color, texture, turgor normal. No rashes or lesions Lymph nodes: Cervical, supraclavicular, and axillary nodes normal. Neurologic: Alert and oriented X 3, normal strength and tone. Normal symmetric reflexes. Normal coordination and gait    Assessment:    Healthy female exam.      Plan:     Anticipatory guidance given including wearing seatbelts, smoke detectors in the home, increasing physical activity, increasing p.o. intake of water and vegetables. -We will obtain labs -Pap up-to-date, done by OB/GYN last week -Given handout -Next CPE in 1 year See After Visit Summary for Counseling Recommendations    Foot callus -Discussed wearing shoes with a larger toe box to prevent rubbing -Discussed rest, ice, compression, and other supportive care  Acquired hypothyroidism  -Continue Synthroid 50 mcg daily - Plan: TSH  Lymphadenopathy  - Plan: US Soft Tissue Head/Neck (NON-THYROID)  Hyperlipidemia, unspecified hyperlipidemia type  - Plan: Lipid panel  Weight gain -Discussed lifestyle modifications - Plan: TSH, Hemoglobin A1c  Vegetarian -Discussed taking multivitamin - Plan: Vitamin B12, Vitamin D, 25-hydroxy  F/u prn  Grier Mitts, MD

## 2020-05-01 LAB — HEMOGLOBIN A1C: Hgb A1c MFr Bld: 5.5 % (ref 4.6–6.5)

## 2020-05-05 ENCOUNTER — Ambulatory Visit
Admission: RE | Admit: 2020-05-05 | Discharge: 2020-05-05 | Disposition: A | Discharge status: Home / Self Care | Payer: BC Managed Care – PPO | Source: Ambulatory Visit | Attending: Family Medicine | Admitting: Family Medicine

## 2020-05-05 DIAGNOSIS — R591 Generalized enlarged lymph nodes: Secondary | ICD-10-CM

## 2020-06-05 ENCOUNTER — Encounter: Payer: Self-pay | Admitting: Family Medicine

## 2020-06-12 ENCOUNTER — Other Ambulatory Visit: Payer: Self-pay

## 2020-06-12 MED ORDER — LEVOTHYROXINE SODIUM 50 MCG PO TABS: 50.0000 ug | ORAL_TABLET | Freq: Every day | ORAL | 3 refills | Status: DC

## 2020-06-22 NOTE — Telephone Encounter (Signed)
Matter previously addressed. 

## 2020-10-03 ENCOUNTER — Other Ambulatory Visit: Payer: Self-pay | Admitting: Family Medicine

## 2021-02-08 ENCOUNTER — Encounter (INDEPENDENT_AMBULATORY_CARE_PROVIDER_SITE_OTHER): Payer: Self-pay | Admitting: Internal Medicine

## 2021-02-08 ENCOUNTER — Other Ambulatory Visit: Payer: Self-pay

## 2021-02-08 ENCOUNTER — Ambulatory Visit (INDEPENDENT_AMBULATORY_CARE_PROVIDER_SITE_OTHER): Payer: BC Managed Care – PPO | Admitting: Internal Medicine

## 2021-02-08 VITALS — BP 133/70 | HR 71 | Temp 98.1°F | Resp 18 | Ht 64.0 in | Wt 172.0 lb

## 2021-02-08 DIAGNOSIS — E282 Polycystic ovarian syndrome: Secondary | ICD-10-CM | POA: Diagnosis not present

## 2021-02-08 DIAGNOSIS — E039 Hypothyroidism, unspecified: Secondary | ICD-10-CM | POA: Diagnosis not present

## 2021-02-08 DIAGNOSIS — E559 Vitamin D deficiency, unspecified: Secondary | ICD-10-CM | POA: Diagnosis not present

## 2021-02-08 HISTORY — DX: Hypothyroidism, unspecified: E03.9

## 2021-02-08 NOTE — Patient Instructions (Signed)
[facility] Dietary Recommendations for Weight Loss What to Avoid . Avoid added sugars o Often added sugar can be found in processed foods such as many condiments, dry cereals, cakes, cookies, chips, crisps, crackers, candies, sweetened drinks, etc.  o Read labels and AVOID/DECREASE use of foods with the following in their ingredient list: Sugar, fructose, high fructose corn syrup, sucrose, glucose, maltose, dextrose, molasses, cane sugar, brown sugar, any type of syrup, agave nectar, etc.   . Avoid snacking in between meals . Avoid foods made with flour o If you are going to eat food made with flour, choose those made with whole-grains; and, minimize your consumption as much as is tolerable . Avoid processed foods o These foods are generally stocked in the middle of the grocery store. Focus on shopping on the perimeter of the grocery.  . Avoid Meat  o We recommend following a plant-based diet at [name]. Thus, we recommend avoiding meat as a general rule. Consider eating beans, legumes, eggs, and/or dairy products for regular protein sources o If you plan on eating meat limit to 4 ounces of meat at a time and choose lean options such as Fish, chicken, turkey. Avoid red meat intake such as pork and/or steak What to Include . Vegetables o GREEN LEAFY VEGETABLES: Kale, spinach, mustard greens, collard greens, cabbage, broccoli, etc. o OTHER: Asparagus, cauliflower, eggplant, carrots, peas, Brussel sprouts, tomatoes, bell peppers, zucchini, beets, cucumbers, etc. . Grains, seeds, and legumes o Beans: kidney beans, black eyed peas, garbanzo beans, black beans, pinto beans, etc. o Whole, unrefined grains: brown rice, barley, bulgur, oatmeal, etc. . Healthy fats  o Avoid highly processed fats such as vegetable oil o Examples of healthy fats: avocado, olives, virgin olive oil, dark chocolate (?72% Cocoa), nuts (peanuts, almonds, walnuts, cashews, pecans, etc.) . None to Low  Intake of Animal Sources of Protein o Meat sources: chicken, turkey, salmon, tuna. Limit to 4 ounces of meat at one time. o Consider limiting dairy sources, but when choosing dairy focus on: PLAIN Greek yogurt, cottage cheese, high-protein milk . Fruit o Choose berries  When to Eat . Intermittent Fasting: o Choosing not to eat for a specific time period, but DO FOCUS ON HYDRATION when fasting o Multiple Techniques: - Time Restricted Eating: eat 3 meals in a day, each meal lasting no more than 60 minutes, no snacks between meals - 16-18 hour fast: fast for 16 to 18 hours up to 7 days a week. Often suggested to start with 2-3 nonconsecutive days per week.  . Remember the time you sleep is counted as fasting.  . Examples of eating schedule: Fast from 7:00pm-11:00am. Eat between 11:00am-7:00pm.  - 24-hour fast: fast for 24 hours up to every other day. Often suggested to start with 1 day per week . Remember the time you sleep is counted as fasting . Examples of eating schedule:  o Eating day: eat 2-3 meals on your eating day. If doing 2 meals, each meal should last no more than 90 minutes. If doing 3 meals, each meal should last no more than 60 minutes. Finish last meal by 7:00pm. o Fasting day: Fast until 7:00pm.  o IF YOU FEEL UNWELL FOR ANY REASON/IN ANY WAY WHEN FASTING, STOP FASTING BY EATING A NUTRITIOUS SNACK OR LIGHT MEAL o ALWAYS FOCUS ON HYDRATION DURING FASTS - Acceptable Hydration sources: water, broths, tea/coffee (black tea/coffee is best but using a small amount of whole-fat dairy products in coffee/tea is acceptable).  -   Poor Hydration Sources: anything with sugar or artificial sweeteners added to it  These recommendations have been developed for patients that are actively receiving medical care from either Dr. Kiylah Loyer or Sarah Gray, DNP, NP-C at [facility]. These recommendations are developed for patients with specific medical conditions and are not meant to be  distributed or used by others that are not actively receiving care from either provider listed above at [name]. It is not appropriate to participate in the above eating plans without proper medical supervision.   Reference: Fung, J. The obesity code. [place]: [place]; 2016.   

## 2021-02-08 NOTE — Progress Notes (Signed)
Metrics: Intervention Frequency ACO  Documented Smoking Status Yearly  Screened one or more times in 24 months  Cessation Counseling or  Active cessation medication Past 24 months  Past 24 months   Guideline developer: UpToDate (See UpToDate for funding source) Date Released: 2014       Wellness Office Visit  Subjective:  Patient ID: Kari Hood, female    DOB: 02-12-1981  Age: 40 y.o. MRN: [id number]  CC: This very pleasant 40 year old lady comes to our practice as a new patient to establish care. HPI She has a history of hypothyroidism since 2007 and is on levothyroxine.  She is finding it difficult to lose weight.  She also has a puffy face, cold intolerance, somewhat tendency towards constipation, some fatigue. She also describes hirsutism but no acne.  She does describe migraines that sometimes, on about a week prior to her menstrual cycle.  Her menstrual cycles are regular.  She has no history of pregnancy or miscarriages. She does have a history of hypercholesterolemia.  However, there is no prediabetes or diabetes. She has a history of vitamin D deficiency.  She takes vitamin D3 5000 units daily. She eats a plant-based diet.  She has tried intermittent fasting but has not been successful in the past.  Past Medical History:  Diagnosis Date  . GERD (gastroesophageal reflux disease)   . History of scleroderma    followed at [facility]  . Hyperlipidemia   . Hypothyroidism 2007  . Hypothyroidism, adult 02/08/2021  . Migraine    Past Surgical History:  Procedure Laterality Date  . TONSILLECTOMY       Family History  Problem Relation Age of Onset  . Hyperlipidemia Mother   . Hypertension Father   . Diabetes Maternal Grandmother   . Heart disease Maternal Grandmother   . Hyperlipidemia Maternal Grandmother   . Hypertension Maternal Grandmother   . Cancer Paternal Grandmother   . Early death Paternal Grandmother   . Depression Paternal Grandfather   . Colon  cancer Neg Hx   . Esophageal cancer Neg Hx   . 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   Social History   Tobacco Use  . Smoking status: Never Smoker  . Smokeless tobacco: Never Used  Substance Use Topics  . Alcohol use: Never    Current Meds  Medication Sig  . Aspirin-Acetaminophen-Caffeine (EXCEDRIN MIGRAINE PO) Take by mouth. As directed.  . B Complex-C (SUPER B COMPLEX PO) Take 1 tablet by mouth daily.  . Cholecalciferol (VITAMIN D3) 1.25 MG (50000 UT) CAPS Take 1 capsule by mouth daily.  Marland Kitchen levothyroxine (SYNTHROID) 50 MCG tablet TAKE 1 TABLET BY MOUTH DAILY BEFORE BREAKFAST  . Turmeric (QC TUMERIC COMPLEX) 500 MG CAPS Take 1 capsule by mouth in the morning and at bedtime.     Flowsheet Row Office Visit from 04/28/2020 in [facility] [place]  PHQ-9 Total Score 0      Objective:   Today's Vitals: BP 133/70 (BP Location: Left Arm, Patient Position: Sitting, Cuff Size: Normal)   Pulse 71   Temp 98.1 F (36.7 C) (Temporal)   Resp 18   Ht 5\' 4"  (1.626 m)   Wt 172 lb (78 kg)   SpO2 98%   BMI 29.52 kg/m  Vitals with BMI 02/08/2021 04/28/2020 11/26/2018  Height 5\' 4"  - 5' 4.5"  Weight 172 lbs 178 lbs 13 oz 176 lbs  BMI 29.51 - 29.75  Systolic 133 98 104  Diastolic 70 72 64  Pulse 71 68 72     Physical Exam  Very pleasant lady.  Overweight, almost obese.  Blood pressure in a reasonable range.     Assessment   1. Vitamin D deficiency disease   2. Hypothyroidism, adult   3. PCOS (polycystic ovarian syndrome)       Tests ordered Orders Placed This Encounter  Procedures  . Follicle stimulating hormone  . Luteinizing hormone  . Testos,Total,Free and SHBG (Female)  . T3, free  . T4, free  . TSH  . VITAMIN D 25 Hydroxy (Vit-D Deficiency, Fractures)  . COMPLETE METABOLIC PANEL WITH GFR      Plan: 1. She will continue with levothyroxine for hypothyroidism for the time being but I have told her that we will need to change this most likely.  Check thyroid function panel. 2. I wonder if she actually has PCOS and we will do the blood work regarding this. 3. Continue with vitamin D3 5000 units daily and we will check vitamin D levels. 4. We discussed intermittent fasting today and the importance of hydration but also salt intake to maintain the fasting state without feeling unwell. 5. I will see her in the next few weeks to discuss all results and further recommendations.   No orders of the defined types were placed in this encounter.   Wilson Singer, MD

## 2021-02-09 LAB — COMPLETE METABOLIC PANEL WITH GFR
ALT: 10 U/L (ref 6–29)
Alkaline phosphatase (APISO): 45 U/L (ref 31–125)
BUN/Creatinine Ratio: 7 (calc) (ref 6–22)
GFR, Est African American: 124 mL/min/{1.73_m2} (ref >=60)
Glucose, Bld: 82 mg/dL (ref 65–139)

## 2021-02-09 LAB — TSH: TSH: 2.31 mIU/L

## 2021-02-09 LAB — LUTEINIZING HORMONE: LH: 10 m[IU]/mL

## 2021-02-13 LAB — VITAMIN D 25 HYDROXY (VIT D DEFICIENCY, FRACTURES): Vit D, 25-Hydroxy: 30 ng/mL (ref 30–100)

## 2021-02-13 LAB — COMPLETE METABOLIC PANEL WITH GFR
AG Ratio: 1.7 (calc) (ref 1.0–2.5)
AST: 14 U/L (ref 10–30)
Albumin: 4.5 g/dL (ref 3.6–5.1)
BUN: 5 mg/dL — ABNORMAL LOW (ref 7–25)
CO2: 26 mmol/L (ref 20–32)
Calcium: 9.4 mg/dL (ref 8.6–10.2)
Chloride: 103 mmol/L (ref 98–110)
Creat: 0.72 mg/dL (ref 0.50–1.10)
GFR, Est Non African American: 107 mL/min/{1.73_m2} (ref >=60)
Globulin: 2.7 g/dL (calc) (ref 1.9–3.7)
Potassium: 4.4 mmol/L (ref 3.5–5.3)
Sodium: 139 mmol/L (ref 135–146)
Total Bilirubin: 0.5 mg/dL (ref 0.2–1.2)
Total Protein: 7.2 g/dL (ref 6.1–8.1)

## 2021-02-13 LAB — T4, FREE: Free T4: 1.3 ng/dL (ref 0.8–1.8)

## 2021-02-13 LAB — FOLLICLE STIMULATING HORMONE: FSH: 3.1 m[IU]/mL

## 2021-02-13 LAB — TESTOS,TOTAL,FREE AND SHBG (FEMALE)
Free Testosterone: 5.9 pg/mL (ref 0.1–6.4)
Sex Hormone Binding: 50 nmol/L (ref 17–124)
Testosterone, Total, LC-MS-MS: 51 ng/dL — ABNORMAL HIGH (ref 2–45)

## 2021-02-13 LAB — T3, FREE: T3, Free: 2.8 pg/mL (ref 2.3–4.2)

## 2021-02-20 ENCOUNTER — Encounter (INDEPENDENT_AMBULATORY_CARE_PROVIDER_SITE_OTHER): Payer: Self-pay | Admitting: Internal Medicine

## 2021-03-13 ENCOUNTER — Other Ambulatory Visit: Payer: Self-pay

## 2021-03-13 ENCOUNTER — Encounter (INDEPENDENT_AMBULATORY_CARE_PROVIDER_SITE_OTHER): Payer: Self-pay | Admitting: Internal Medicine

## 2021-03-13 ENCOUNTER — Ambulatory Visit (INDEPENDENT_AMBULATORY_CARE_PROVIDER_SITE_OTHER): Payer: BC Managed Care – PPO | Admitting: Internal Medicine

## 2021-03-13 VITALS — BP 122/70 | HR 89 | Temp 97.9°F | Resp 18 | Ht 64.0 in | Wt 172.0 lb

## 2021-03-13 DIAGNOSIS — E559 Vitamin D deficiency, unspecified: Secondary | ICD-10-CM

## 2021-03-13 DIAGNOSIS — E039 Hypothyroidism, unspecified: Secondary | ICD-10-CM

## 2021-03-13 DIAGNOSIS — E282 Polycystic ovarian syndrome: Secondary | ICD-10-CM | POA: Diagnosis not present

## 2021-03-13 DIAGNOSIS — M545 Low back pain, unspecified: Secondary | ICD-10-CM | POA: Diagnosis not present

## 2021-03-13 MED ORDER — NP THYROID 60 MG PO TABS: 60.0000 mg | ORAL_TABLET | Freq: Every day | ORAL | 3 refills | Status: DC

## 2021-03-13 NOTE — Progress Notes (Signed)
Metrics: Intervention Frequency ACO  Documented Smoking Status Yearly  Screened one or more times in 24 months  Cessation Counseling or  Active cessation medication Past 24 months  Past 24 months   Guideline developer: UpToDate (See UpToDate for funding source) Date Released: 2014       Wellness Office Visit  Subjective:  Patient ID: Kari Hood, female    DOB: [birth year]  Age: 40 y.o. MRN: [id number]  CC: This very pleasant lady comes in for follow-up regarding her blood work and further recommendations. HPI  She did have COVID-19 disease at the beginning of April and thankfully has recovered.  However, since this episode, she has had right lower back pain which does not really significantly radiate down her right leg.  She had denies any injury.  She does not seem to get relief. I reviewed all her blood work with her which shows suboptimal vitamin D levels. She also has a classical flip of the ratio between FSH/LH, indicative of the diagnosis of PCOS.  I discussed this with her. Past Medical History:  Diagnosis Date  . GERD (gastroesophageal reflux disease)   . History of scleroderma    followed at [facility]  . Hyperlipidemia   . Hypothyroidism 2007  . Hypothyroidism, adult 02/08/2021  . Migraine    Past Surgical History:  Procedure Laterality Date  . TONSILLECTOMY       Family History  Problem Relation Age of Onset  . Hyperlipidemia Mother   . Hypertension Father   . Diabetes Maternal Grandmother   . Heart disease Maternal Grandmother   . Hyperlipidemia Maternal Grandmother   . Hypertension Maternal Grandmother   . Cancer Paternal Grandmother   . Early death Paternal Grandmother   . Depression Paternal Grandfather   . Colon cancer Neg Hx   . Esophageal cancer Neg Hx   . 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   Social History   Tobacco Use  . Smoking status: Never Smoker  . Smokeless tobacco: Never Used  Substance Use Topics  . Alcohol use: Never    Current Meds  Medication Sig  . Aspirin-Acetaminophen-Caffeine (EXCEDRIN MIGRAINE PO) Take by mouth. As directed.  . B Complex-C (SUPER B COMPLEX PO) Take 1 tablet by mouth daily.  . Cholecalciferol (VITAMIN D3) 1.25 MG (50000 UT) CAPS Take 1 capsule by mouth daily.  Marland Kitchen levothyroxine (SYNTHROID) 50 MCG tablet TAKE 1 TABLET BY MOUTH DAILY BEFORE BREAKFAST  . NP THYROID 60 MG tablet Take 1 tablet (60 mg total) by mouth daily before breakfast.  . Turmeric 500 MG CAPS Take 1 capsule by mouth in the morning and at bedtime.     Flowsheet Row Office Visit from 04/28/2020 in [facility] [place]  PHQ-9 Total Score 0      Objective:   Today's Vitals: BP 122/70 (BP Location: Right Arm, Patient Position: Sitting, Cuff Size: Normal)   Pulse 89   Temp 97.9 F (36.6 C) (Temporal)   Resp 18   Ht 5\' 4"  (1.626 m)   Wt 172 lb (78 kg)   SpO2 98%   BMI 29.52 kg/m  Vitals with BMI 03/13/2021 02/08/2021 04/28/2020  Height 5\' 4"  5\' 4"  -  Weight 172 lbs 172 lbs 178 lbs 13 oz  BMI 29.51 29.51 -  Systolic 122 133 98  Diastolic 70 70  72  Pulse 89 71 68     Physical Exam  Examination of her back does not show any bony spinal tenderness.  Also I cannot really appreciate any muscular spasm in the right minor paramedian area.  I suspect she does have a muscular sprain however.     Assessment   1. Hypothyroidism, adult   2. Vitamin D deficiency disease   3. PCOS (polycystic ovarian syndrome)   4. Acute right-sided low back pain without sciatica       Tests ordered No orders of the defined types were placed in this encounter.    Plan: 1. As far as the right low back pain is concerned, I recommended ibuprofen 600 mg 3 times a day with food for the next 3 to 5 days and also back stretching exercises which I demonstrated to  her.  If she does not improve, she will let me know and we may need to evaluate further. 2. After discussion and shared decision making, we agreed to switch her thyroid medication from levothyroxine to NP thyroid. 3. I recommend that she increase the vitamin D3 to 10,000 units daily. 4. I discussed her new diagnosis of PCOS and the pathophysiology.  We must reduce visceral fat and thereby reduce insulin resistance.  Therefore we discussed again nutrition with the intermittent fasting as well as the plant-based diet that she is already on.  She will try her best. 5. Follow-up in about 6 weeks time to see how she is doing.   Meds ordered this encounter  Medications  . NP THYROID 60 MG tablet    Sig: Take 1 tablet (60 mg total) by mouth daily before breakfast.    Dispense:  30 tablet    Refill:  3    Gerold Sar Normajean Glasgow, MD

## 2021-04-06 ENCOUNTER — Other Ambulatory Visit: Payer: Self-pay | Admitting: Family Medicine

## 2021-04-13 ENCOUNTER — Encounter (INDEPENDENT_AMBULATORY_CARE_PROVIDER_SITE_OTHER): Payer: Self-pay | Admitting: Internal Medicine

## 2021-04-13 MED ORDER — PREDNISONE 20 MG PO TABS: 40.0000 mg | ORAL_TABLET | Freq: Every day | ORAL | 1 refills | Status: DC

## 2021-04-24 ENCOUNTER — Other Ambulatory Visit: Payer: Self-pay

## 2021-04-24 ENCOUNTER — Ambulatory Visit (INDEPENDENT_AMBULATORY_CARE_PROVIDER_SITE_OTHER): Payer: BC Managed Care – PPO | Admitting: Internal Medicine

## 2021-04-24 ENCOUNTER — Encounter (INDEPENDENT_AMBULATORY_CARE_PROVIDER_SITE_OTHER): Payer: Self-pay | Admitting: Internal Medicine

## 2021-04-24 VITALS — BP 118/78 | HR 68 | Temp 98.0°F | Ht 64.0 in | Wt 163.4 lb

## 2021-04-24 DIAGNOSIS — E559 Vitamin D deficiency, unspecified: Secondary | ICD-10-CM | POA: Diagnosis not present

## 2021-04-24 DIAGNOSIS — E039 Hypothyroidism, unspecified: Secondary | ICD-10-CM | POA: Diagnosis not present

## 2021-04-24 DIAGNOSIS — M545 Low back pain, unspecified: Secondary | ICD-10-CM

## 2021-04-24 DIAGNOSIS — E282 Polycystic ovarian syndrome: Secondary | ICD-10-CM

## 2021-04-24 NOTE — Progress Notes (Signed)
Metrics: Intervention Frequency ACO  Documented Smoking Status Yearly  Screened one or more times in 24 months  Cessation Counseling or  Active cessation medication Past 24 months  Past 24 months   Guideline developer: UpToDate (See UpToDate for funding source) Date Released: 2014       Wellness Office Visit  Subjective:  Patient ID: Kari Hood, female    DOB: 1981/11/18  Age: 40 y.o. MRN: [id number]  CC: This lady comes in for follow-up of hypothyroidism, PCOS, vitamin D deficiency and low back pain. HPI  Since last visit, she feels much improved with more energy.  She has also been doing intermittent fasting and has been successful in losing weight. She is tolerating NP thyroid 60 mg daily without any problems. She feels that the NP thyroid is giving her much more energy and sense of wellbeing. She has been taking vitamin D3 10,000 units daily faithfully. She still has had low back pain and now it seems that she has numbness in the right anterior lower leg.  Whenever she exerts herself more than normal, she gets the low back pain.  This back pain has been going on for approximately 6 months. Past Medical History:  Diagnosis Date  . GERD (gastroesophageal reflux disease)   . History of scleroderma    followed at [facility]  . Hyperlipidemia   . Hypothyroidism 2007  . Hypothyroidism, adult 02/08/2021  . Migraine    Past Surgical History:  Procedure Laterality Date  . TONSILLECTOMY       Family History  Problem Relation Age of Onset  . Hyperlipidemia Mother   . Hypertension Father   . Diabetes Maternal Grandmother   . Heart disease Maternal Grandmother   . Hyperlipidemia Maternal Grandmother   . Hypertension Maternal Grandmother   . Cancer Paternal Grandmother   . Early death Paternal Grandmother   . Depression Paternal Grandfather   . Colon cancer Neg Hx   . Esophageal cancer Neg Hx   . 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   Social History   Tobacco Use  . Smoking status: Never Smoker  . Smokeless tobacco: Never Used  Substance Use Topics  . Alcohol use: Never    Current Meds  Medication Sig  . Aspirin-Acetaminophen-Caffeine (EXCEDRIN MIGRAINE PO) Take by mouth. As directed.  . B Complex-C (SUPER B COMPLEX PO) Take 1 tablet by mouth daily.  Claris Gower Grape-Goldenseal (BERBERINE COMPLEX PO) Take 1 capsule by mouth daily.  . Cholecalciferol (VITAMIN D3) 1.25 MG (50000 UT) CAPS Take 2 capsules by mouth daily.  . NP THYROID 60 MG tablet Take 1 tablet (60 mg total) by mouth daily before breakfast.  . Turmeric 500 MG CAPS Take 1 capsule by mouth in the morning and at bedtime.     Flowsheet Row Office Visit from 04/24/2021 in [facility]  PHQ-9 Total Score 0      Objective:   Today's Vitals: BP 118/78   Pulse 68   Temp 98 F (36.7 C) (Temporal)   Ht 5\' 4"  (1.626 m)   Wt 163 lb 6.4 oz (74.1 kg)   SpO2 97%   BMI 28.05 kg/m  Vitals with BMI 04/24/2021 03/13/2021 02/08/2021  Height 5\' 4"  5\' 4"  5\' 4"   Weight 163 lbs 6 oz 172 lbs 172 lbs  BMI 28.03 29.51 29.51  Systolic 118 122 814  Diastolic 78 70 70  Pulse 68 89 71     Physical Exam  She looks systemically well and has lost 9 pounds since the last visit.  This is great work.     Assessment   1. Hypothyroidism, adult   2. PCOS (polycystic ovarian syndrome)   3. Vitamin D deficiency disease   4. Acute right-sided low back pain without sciatica       Tests ordered Orders Placed This Encounter  Procedures  . MR Lumbar Spine Wo Contrast  . COMPLETE METABOLIC PANEL WITH GFR  . VITAMIN D 25 Hydroxy (Vit-D Deficiency, Fractures)  . T3, free  . TSH     Plan: 1. She will continue with NP thyroid 60 mg daily and we will check thyroid function today to see if we need to further  optimize. 2. Continue with vitamin D3 10,000 units daily and we will check vitamin D levels. 3. Continue to work on nutrition for reduction of visceral fat and improvement of PCOS. 4. We will set up an MRI lumbar spine for further evaluation of her low back pain. 5. Follow-up in about 6 weeks or so.   No orders of the defined types were placed in this encounter.   Wilson Singer, MD

## 2021-04-25 ENCOUNTER — Other Ambulatory Visit (INDEPENDENT_AMBULATORY_CARE_PROVIDER_SITE_OTHER): Payer: Self-pay | Admitting: Internal Medicine

## 2021-04-25 LAB — COMPLETE METABOLIC PANEL WITH GFR
AG Ratio: 1.7 (calc) (ref 1.0–2.5)
ALT: 11 U/L (ref 6–29)
AST: 12 U/L (ref 10–30)
Albumin: 4.5 g/dL (ref 3.6–5.1)
Alkaline phosphatase (APISO): 41 U/L (ref 31–125)
BUN: 11 mg/dL (ref 7–25)
CO2: 26 mmol/L (ref 20–32)
Calcium: 9.5 mg/dL (ref 8.6–10.2)
Chloride: 103 mmol/L (ref 98–110)
Creat: 0.65 mg/dL (ref 0.50–1.10)
GFR, Est African American: 131 mL/min/{1.73_m2} (ref >=60)
GFR, Est Non African American: 113 mL/min/{1.73_m2} (ref >=60)
Globulin: 2.7 g/dL (calc) (ref 1.9–3.7)
Glucose, Bld: 75 mg/dL (ref 65–139)
Potassium: 4.4 mmol/L (ref 3.5–5.3)
Sodium: 139 mmol/L (ref 135–146)
Total Bilirubin: 0.3 mg/dL (ref 0.2–1.2)
Total Protein: 7.2 g/dL (ref 6.1–8.1)

## 2021-04-25 LAB — T3, FREE: T3, Free: 4 pg/mL (ref 2.3–4.2)

## 2021-04-25 LAB — VITAMIN D 25 HYDROXY (VIT D DEFICIENCY, FRACTURES): Vit D, 25-Hydroxy: 55 ng/mL (ref 30–100)

## 2021-04-25 LAB — TSH: TSH: 0.88 mIU/L

## 2021-05-01 ENCOUNTER — Ambulatory Visit (HOSPITAL_COMMUNITY): Payer: BC Managed Care – PPO

## 2021-05-09 ENCOUNTER — Ambulatory Visit (HOSPITAL_COMMUNITY): Payer: BC Managed Care – PPO

## 2021-05-09 ENCOUNTER — Encounter (HOSPITAL_COMMUNITY): Payer: Self-pay

## 2021-05-10 ENCOUNTER — Encounter (INDEPENDENT_AMBULATORY_CARE_PROVIDER_SITE_OTHER): Payer: Self-pay | Admitting: Internal Medicine

## 2021-05-14 ENCOUNTER — Encounter (INDEPENDENT_AMBULATORY_CARE_PROVIDER_SITE_OTHER): Payer: Self-pay | Admitting: Internal Medicine

## 2021-05-14 ENCOUNTER — Ambulatory Visit (HOSPITAL_COMMUNITY): Payer: BC Managed Care – PPO

## 2021-05-14 NOTE — Telephone Encounter (Signed)
Pt would need some more informations.  [phone] Peer to Peer.  ID# [id number]

## 2021-05-17 ENCOUNTER — Other Ambulatory Visit (INDEPENDENT_AMBULATORY_CARE_PROVIDER_SITE_OTHER): Payer: Self-pay | Admitting: Nurse Practitioner

## 2021-05-17 ENCOUNTER — Other Ambulatory Visit: Payer: Self-pay

## 2021-05-17 ENCOUNTER — Ambulatory Visit (HOSPITAL_COMMUNITY)
Admission: RE | Admit: 2021-05-17 | Discharge: 2021-05-17 | Disposition: A | Discharge status: Home / Self Care | Payer: BC Managed Care – PPO | Source: Ambulatory Visit | Attending: Internal Medicine | Admitting: Internal Medicine

## 2021-05-17 DIAGNOSIS — M545 Low back pain, unspecified: Secondary | ICD-10-CM | POA: Diagnosis not present

## 2021-06-11 ENCOUNTER — Encounter (INDEPENDENT_AMBULATORY_CARE_PROVIDER_SITE_OTHER): Payer: Self-pay | Admitting: Internal Medicine

## 2021-06-11 ENCOUNTER — Ambulatory Visit (INDEPENDENT_AMBULATORY_CARE_PROVIDER_SITE_OTHER): Payer: BC Managed Care – PPO | Admitting: Internal Medicine

## 2021-06-11 ENCOUNTER — Other Ambulatory Visit: Payer: Self-pay

## 2021-06-11 VITALS — BP 122/74 | HR 73 | Temp 97.9°F | Ht 64.0 in | Wt 165.6 lb

## 2021-06-11 DIAGNOSIS — E559 Vitamin D deficiency, unspecified: Secondary | ICD-10-CM | POA: Diagnosis not present

## 2021-06-11 DIAGNOSIS — M545 Low back pain, unspecified: Secondary | ICD-10-CM | POA: Diagnosis not present

## 2021-06-11 DIAGNOSIS — E039 Hypothyroidism, unspecified: Secondary | ICD-10-CM

## 2021-06-11 DIAGNOSIS — E282 Polycystic ovarian syndrome: Secondary | ICD-10-CM

## 2021-06-11 NOTE — Progress Notes (Signed)
Metrics: Intervention Frequency ACO  Documented Smoking Status Yearly  Screened one or more times in 24 months  Cessation Counseling or  Active cessation medication Past 24 months  Past 24 months   Guideline developer: UpToDate (See UpToDate for funding source) Date Released: 2014       Wellness Office Visit  Subjective:  Patient ID: Kari Hood, female    DOB: 09-23-1981  Age: 40 y.o. MRN: [id number]  CC: This lady comes in for follow-up of PCOS, hypothyroidism, vitamin D deficiency. HPI  She continues on NP thyroid 60 mg daily and she feels very good on this. She continues to work on nutrition and exercise but she is currently having back pain and she is due to see neurosurgery soon.  She had a very abnormal MRI lumbar spine. She continues on vitamin D3 supplementation for vitamin D deficiency. Past Medical History:  Diagnosis Date   GERD (gastroesophageal reflux disease)    History of scleroderma    followed at [facility]   Hyperlipidemia    Hypothyroidism 2007   Hypothyroidism, adult 02/08/2021   Migraine    Past Surgical History:  Procedure Laterality Date   TONSILLECTOMY       Family History  Problem Relation Age of Onset   Hyperlipidemia Mother    Hypertension Father    Diabetes Maternal Grandmother    Heart disease Maternal Grandmother    Hyperlipidemia Maternal Grandmother    Hypertension Maternal Grandmother    Cancer Paternal Grandmother    Early death Paternal Grandmother    Depression Paternal Grandfather    Colon cancer Neg Hx    Esophageal cancer Neg Hx    Rectal cancer Neg Hx     Social History   Social History Narrative   Married for 3 years.Lives with husband.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   Social History   Tobacco Use   Smoking status: Never   Smokeless tobacco: Never  Substance Use Topics   Alcohol use: Never    Current Meds  Medication Sig    Aspirin-Acetaminophen-Caffeine (EXCEDRIN MIGRAINE PO) Take by mouth. As directed.   B Complex-C (SUPER B COMPLEX PO) Take 1 tablet by mouth daily.   Barberry-Oreg Grape-Goldenseal (BERBERINE COMPLEX PO) Take 1 capsule by mouth daily.   Cholecalciferol (VITAMIN D3) 1.25 MG (50000 UT) CAPS Take 2 capsules by mouth daily.   NP THYROID 60 MG tablet Take 1 tablet (60 mg total) by mouth daily before breakfast.   Turmeric 500 MG CAPS Take 1 capsule by mouth in the morning and at bedtime.     Flowsheet Row Office Visit from 04/24/2021 in [facility]  PHQ-9 Total Score 0       Objective:   Today's Vitals: BP 122/74   Pulse 73   Temp 97.9 F (36.6 C) (Temporal)   Ht 5\' 4"  (1.626 m)   Wt 165 lb 9.6 oz (75.1 kg)   SpO2 99%   BMI 28.43 kg/m  Vitals with BMI 06/11/2021 04/24/2021 03/13/2021  Height 5\' 4"  5\' 4"  5\' 4"   Weight 165 lbs 10 oz 163 lbs 6 oz 172 lbs  BMI 28.41 28.03 29.51  Systolic 122 118 03/15/2021  Diastolic 74 78 70  Pulse 73 68 89     Physical Exam She looks systemically well.  She has gained a couple of pounds since last visit.  However she is still 7 pounds down from April of this year.      Assessment  1. Hypothyroidism, adult   2. PCOS (polycystic ovarian syndrome)   3. Acute right-sided low back pain without sciatica   4. Vitamin D deficiency disease       Tests ordered No orders of the defined types were placed in this encounter.    Plan: 1.  Continue with the same dose of NP thyroid. 2.  Continue to work on nutrition. 3.  Continue with vitamin D3 supplementation for vitamin D deficiency. 4.  She will see neurosurgery in the next couple of weeks and we will see what the recommendations are. 5.  I will see her in January for an annual physical exam.    No orders of the defined types were placed in this encounter.   Wilson Singer, MD

## 2021-07-03 ENCOUNTER — Other Ambulatory Visit (INDEPENDENT_AMBULATORY_CARE_PROVIDER_SITE_OTHER): Payer: Self-pay

## 2021-07-03 ENCOUNTER — Telehealth (INDEPENDENT_AMBULATORY_CARE_PROVIDER_SITE_OTHER): Payer: Self-pay

## 2021-07-03 DIAGNOSIS — E039 Hypothyroidism, unspecified: Secondary | ICD-10-CM

## 2021-07-03 MED ORDER — NP THYROID 60 MG PO TABS: 60.0000 mg | ORAL_TABLET | Freq: Every day | ORAL | 2 refills | Status: DC

## 2021-07-03 NOTE — Telephone Encounter (Signed)
Patient called and requested refills for the following to help her get through until they can get in with a new provider.  NP THYROID 60 MG tablet  Last filled 03/13/2021, # 30 with 3 refills

## 2021-07-03 NOTE — Telephone Encounter (Signed)
Medication approved and sent to patient's pharmacy.  Please let me know if she would like referral to endocrinology.  Thank you.

## 2021-07-04 NOTE — Telephone Encounter (Signed)
Called patient and she did request an endocrinology referral for [place]. Patient verbalized an understanding and thanked [place].

## 2021-07-05 NOTE — Telephone Encounter (Signed)
Referral for endocrinology ordered.

## 2021-07-05 NOTE — Addendum Note (Signed)
Addended by: Jiles Prows E on: 07/05/2021 11:28 AM   Modules accepted: Orders

## 2021-08-10 ENCOUNTER — Encounter: Payer: Self-pay | Admitting: Registered Nurse

## 2021-08-10 ENCOUNTER — Other Ambulatory Visit: Payer: Self-pay

## 2021-08-10 ENCOUNTER — Ambulatory Visit: Payer: BC Managed Care – PPO | Admitting: Registered Nurse

## 2021-08-10 VITALS — BP 122/80 | HR 76 | Ht 64.0 in | Wt 170.0 lb

## 2021-08-10 DIAGNOSIS — Z130 Encounter for screening for diseases of the blood and blood-forming organs and certain disorders involving the immune mechanism: Secondary | ICD-10-CM

## 2021-08-10 DIAGNOSIS — Z1329 Encounter for screening for other suspected endocrine disorder: Secondary | ICD-10-CM | POA: Diagnosis not present

## 2021-08-10 DIAGNOSIS — Z13228 Encounter for screening for other metabolic disorders: Secondary | ICD-10-CM

## 2021-08-10 DIAGNOSIS — E039 Hypothyroidism, unspecified: Secondary | ICD-10-CM | POA: Diagnosis not present

## 2021-08-10 DIAGNOSIS — E785 Hyperlipidemia, unspecified: Secondary | ICD-10-CM

## 2021-08-10 MED ORDER — NP THYROID 60 MG PO TABS: 60.0000 mg | ORAL_TABLET | Freq: Every day | ORAL | 2 refills | Status: DC

## 2021-08-10 NOTE — Progress Notes (Signed)
New Patient Office Visit  Subjective:  Patient ID: Kari Hood, female    DOB: 07-Mar-1981  Age: 40 y.o. MRN: [id number]  CC:  Chief Complaint  Patient presents with   Establish Care   Hyperthyroidism    HPI Kari Hood presents for est care  Thyroid On NP thyroid 60mg  daily with Dr. in past. Hopes to continue Notes AE when trying synthroid in the past. Does not want to change therapy. No AE associated with thyroid dysfunction at this time.  Herniated disc Occurred last dec Doing well, pursuing rehab and PT, grateful to avoid surgery. No new or different symptoms.  Continues to increase exercise.  Hx of hyperlipidemia Lab Results  Component Value Date   CHOL 191 04/28/2020   HDL 45.70 04/28/2020   LDLCALC 127 (H) 04/28/2020   TRIG 92.0 04/28/2020   CHOLHDL 4 04/28/2020  Stable. Wants to recheck Has not been on statin for this, reluctant to consider this therapy Does have fam hx of hld.  Headaches More often lately Sometimes when missing meal Pain on waking some mornings, then gets worse Occ nausea with headaches Excedrin migraine mostly provides relief     Otherwise no acute concerns  Past Medical History:  Diagnosis Date   GERD (gastroesophageal reflux disease)    History of scleroderma    followed at [facility]   Hyperlipidemia    Hypothyroidism 2007   Hypothyroidism, adult 02/08/2021   Migraine     Past Surgical History:  Procedure Laterality Date   TONSILLECTOMY      Family History  Problem Relation Age of Onset   Hyperlipidemia Mother    Hypertension Father    Diabetes Maternal Grandmother    Heart disease Maternal Grandmother    Hyperlipidemia Maternal Grandmother    Hypertension Maternal Grandmother    Cancer Paternal Grandmother    Early death Paternal Grandmother    Cervical cancer Paternal Grandmother    Depression Paternal Grandfather    Colon cancer Neg Hx    Esophageal cancer Neg Hx    Rectal cancer  Neg Hx     Social History   Socioeconomic History   Marital status: Married    Spouse name: 02/10/2021   Number of children: 0   Years of education: Not on file   Highest education level: Not on file  Occupational History   Occupation: professor at [name]  Tobacco Use   Smoking status: Never   Smokeless tobacco: Never  Vaping Use   Vaping Use: Never used  Substance and Sexual Activity   Alcohol use: Never   Drug use: Never   Sexual activity: Yes  Other Topics Concern   Not on file  Social History Narrative   Married for 3 years.   Lives with husband.   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   Husband is [employer] at [employer].   Social Determinants of Health   Financial Resource Strain: Not on file  Food Insecurity: Not on file  Transportation Needs: Not on file  Physical Activity: Not on file  Stress: Not on file  Social Connections: Not on file  Intimate Partner Violence: Not on file    ROS Review of Systems  Constitutional: Negative.   HENT: Negative.    Eyes: Negative.   Respiratory: Negative.    Cardiovascular: Negative.   Gastrointestinal: Negative.   Genitourinary: Negative.   Musculoskeletal: Negative.   Skin: Negative.   Neurological: Negative.   Psychiatric/Behavioral: Negative.  All other systems reviewed and are negative.  Objective:   Today's Vitals: BP 122/80   Pulse 76   Ht 5\' 4"  (1.626 m)   Wt 170 lb (77.1 kg)   LMP 07/26/2021   SpO2 98%   BMI 29.18 kg/m   Physical Exam Vitals and nursing note reviewed.  Constitutional:      General: She is not in acute distress.    Appearance: Normal appearance. She is not ill-appearing, toxic-appearing or diaphoretic.  Cardiovascular:     Rate and Rhythm: Normal rate and regular rhythm.     Pulses: Normal pulses.     Heart sounds: Normal heart sounds. No murmur heard.   No friction rub. No gallop.  Pulmonary:     Effort: Pulmonary effort is normal. No  respiratory distress.     Breath sounds: Normal breath sounds. No stridor. No wheezing, rhonchi or rales.  Chest:     Chest wall: No tenderness.  Skin:    General: Skin is warm and dry.     Capillary Refill: Capillary refill takes less than 2 seconds.  Neurological:     General: No focal deficit present.     Mental Status: She is alert and oriented to person, place, and time. Mental status is at baseline.  Psychiatric:        Mood and Affect: Mood normal.        Behavior: Behavior normal.        Thought Content: Thought content normal.        Judgment: Judgment normal.    Assessment & Plan:   Problem List Items Addressed This Visit   None Visit Diagnoses     Acquired hypothyroidism    -  Primary   Relevant Orders   TSH   Hyperlipidemia, unspecified hyperlipidemia type       Relevant Orders   Lipid panel   Screening for endocrine, metabolic and immunity disorder       Relevant Orders   CBC with Differential/Platelet   Comprehensive metabolic panel   Hemoglobin A1c       Outpatient Encounter Medications as of 08/10/2021  Medication Sig   Aspirin-Acetaminophen-Caffeine (EXCEDRIN MIGRAINE PO) Take by mouth. As directed.   B Complex-C (SUPER B COMPLEX PO) Take 1 tablet by mouth daily.   Barberry-Oreg Grape-Goldenseal (BERBERINE COMPLEX PO) Take 1 capsule by mouth daily.   Cholecalciferol (VITAMIN D3) 1.25 MG (50000 UT) CAPS Take 2 capsules by mouth daily.   NP THYROID 60 MG tablet Take 1 tablet (60 mg total) by mouth daily before breakfast.   Turmeric 500 MG CAPS Take 1 capsule by mouth in the morning and at bedtime.   No facility-administered encounter medications on file as of 08/10/2021.    Follow-up: No follow-ups on file.   PLAN Courtesy refill on NP thyroid given but discussed with patient that I cannot manage this long term. She does have appt upcoming with Dr. 08/12/2021 in endo, and if need be, can reach out to [facility] MD. Info given. Labs collected. Will follow up  with the patient as warranted. Return based on lab results. If lipids steady, 6 mo. If not, will discuss plan to adjust Optimistic headache will improve with routine eating and adequate hydration, likely to improve as she continues to work in more exercise. BP wnl today. Can consider sumatriptan or propranolol, discussed risks, benefits, and alternatives to each today. Pt voices understanding, mutual decision to hold on pharmacotherapy at this time.  Patient encouraged to call  clinic with any questions, comments, or concerns.  Janeece Agee, NP

## 2021-08-10 NOTE — Patient Instructions (Addendum)
Great to meet you, Dr. Excell Seltzer!  [facility] [address] [place], [place], [place] [address] [phone]   No formal referral required - Dr. Fransico Him will likely be able to assume care, though it's not a bad idea to have a back up!  I'll let you know how labs look in the next few days.   Let me know if you need anything  Thanks,  Luan Pulling

## 2021-08-11 LAB — LIPID PANEL
Cholesterol: 210 mg/dL — ABNORMAL HIGH (ref <200)
HDL: 54 mg/dL (ref >=50)
LDL Cholesterol (Calc): 131 mg/dL (calc) — ABNORMAL HIGH
Non-HDL Cholesterol (Calc): 156 mg/dL (calc) — ABNORMAL HIGH (ref <130)
Total CHOL/HDL Ratio: 3.9 (calc) (ref <5.0)
Triglycerides: 133 mg/dL (ref <150)

## 2021-08-11 LAB — COMPREHENSIVE METABOLIC PANEL
AG Ratio: 1.7 (calc) (ref 1.0–2.5)
ALT: 12 U/L (ref 6–29)
AST: 15 U/L (ref 10–30)
Albumin: 4.9 g/dL (ref 3.6–5.1)
Alkaline phosphatase (APISO): 52 U/L (ref 31–125)
BUN: 10 mg/dL (ref 7–25)
CO2: 25 mmol/L (ref 20–32)
Calcium: 9.7 mg/dL (ref 8.6–10.2)
Chloride: 101 mmol/L (ref 98–110)
Creat: 0.65 mg/dL (ref 0.50–0.97)
Globulin: 2.9 g/dL (calc) (ref 1.9–3.7)
Glucose, Bld: 69 mg/dL (ref 65–99)
Potassium: 3.9 mmol/L (ref 3.5–5.3)
Sodium: 139 mmol/L (ref 135–146)
Total Bilirubin: 0.3 mg/dL (ref 0.2–1.2)
Total Protein: 7.8 g/dL (ref 6.1–8.1)

## 2021-08-11 LAB — CBC WITH DIFFERENTIAL/PLATELET
Absolute Monocytes: 491 cells/uL (ref 200–950)
Basophils Absolute: 94 cells/uL (ref 0–200)
Basophils Relative: 1.2 %
Eosinophils Absolute: 203 cells/uL (ref 15–500)
Eosinophils Relative: 2.6 %
HCT: 44.6 % (ref 35.0–45.0)
Hemoglobin: 15.4 g/dL (ref 11.7–15.5)
Lymphs Abs: 2621 cells/uL (ref 850–3900)
MCH: 31.2 pg (ref 27.0–33.0)
MCHC: 34.5 g/dL (ref 32.0–36.0)
MCV: 90.3 fL (ref 80.0–100.0)
MPV: 11.1 fL (ref 7.5–12.5)
Monocytes Relative: 6.3 %
Neutro Abs: 4391 cells/uL (ref 1500–7800)
Neutrophils Relative %: 56.3 %
Platelets: 356 10*3/uL (ref 140–400)
RBC: 4.94 10*6/uL (ref 3.80–5.10)
RDW: 12.2 % (ref 11.0–15.0)
Total Lymphocyte: 33.6 %
WBC: 7.8 10*3/uL (ref 3.8–10.8)

## 2021-08-11 LAB — HEMOGLOBIN A1C
Hgb A1c MFr Bld: 5 % of total Hgb (ref <5.7)
Mean Plasma Glucose: 97 mg/dL
eAG (mmol/L): 5.4 mmol/L

## 2021-08-11 LAB — TSH: TSH: 1.22 mIU/L

## 2021-08-22 ENCOUNTER — Encounter: Payer: Self-pay | Admitting: "Endocrinology

## 2021-08-22 ENCOUNTER — Ambulatory Visit: Payer: BC Managed Care – PPO | Admitting: "Endocrinology

## 2021-08-22 ENCOUNTER — Other Ambulatory Visit: Payer: Self-pay

## 2021-08-22 VITALS — BP 126/72 | HR 72 | Ht 64.0 in | Wt 173.2 lb

## 2021-08-22 DIAGNOSIS — E782 Mixed hyperlipidemia: Secondary | ICD-10-CM | POA: Diagnosis not present

## 2021-08-22 DIAGNOSIS — E559 Vitamin D deficiency, unspecified: Secondary | ICD-10-CM | POA: Diagnosis not present

## 2021-08-22 DIAGNOSIS — E039 Hypothyroidism, unspecified: Secondary | ICD-10-CM | POA: Diagnosis not present

## 2021-08-22 MED ORDER — SYNTHROID 50 MCG PO TABS: 50.0000 ug | ORAL_TABLET | Freq: Every day | ORAL | 2 refills | Status: DC

## 2021-08-22 NOTE — Progress Notes (Addendum)
Endocrinology Consult Note                                            08/23/2021, 11:43 AM   Subjective:    Patient ID: Kari Hood, female    DOB: [birth year], PCP Janeece Agee, NP   Past Medical History:  Diagnosis Date   GERD (gastroesophageal reflux disease)    History of scleroderma    followed at [facility]   Hyperlipidemia    Hypothyroidism 2007   Hypothyroidism, adult 02/08/2021   Migraine    Past Surgical History:  Procedure Laterality Date   TONSILLECTOMY     Social History   Socioeconomic History   Marital status: Married    Spouse name: [name]   Number of children: 0   Years of education: Not on file   Highest education level: Not on file  Occupational History   Occupation: professor at [employer]  Tobacco Use   Smoking status: Never   Smokeless tobacco: Never  Vaping Use   Vaping Use: Never used  Substance and Sexual Activity   Alcohol use: Never   Drug use: Never   Sexual activity: Yes  Other Topics Concern   Not on file  Social History Narrative   Married for 3 years.   Lives with husband.   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   Husband is Paramedic at [employer].   Social Determinants of Health   Financial Resource Strain: Not on file  Food Insecurity: Not on file  Transportation Needs: Not on file  Physical Activity: Not on file  Stress: Not on file  Social Connections: Not on file   Family History  Problem Relation Age of Onset   Thyroid disease Mother    Hyperlipidemia Mother    Hypertension Father    Diabetes Maternal Grandmother    Heart disease Maternal Grandmother    Hyperlipidemia Maternal Grandmother    Hypertension Maternal Grandmother    Cancer Paternal Grandmother    Early death Paternal Grandmother    Cervical cancer Paternal Grandmother    Depression Paternal Grandfather    Colon cancer Neg Hx    Esophageal cancer Neg Hx    Rectal cancer Neg Hx     Outpatient Encounter Medications as of 08/22/2021  Medication Sig   Cholecalciferol (DIALYVITE VITAMIN D 5000 PO) Take 5,000 Units by mouth daily with breakfast.   SYNTHROID 50 MCG tablet Take 1 tablet (50 mcg total) by mouth daily before breakfast.   Aspirin-Acetaminophen-Caffeine (EXCEDRIN MIGRAINE PO) Take by mouth. As directed.   B Complex-C (SUPER B COMPLEX PO) Take 1 tablet by mouth daily.   Barberry-Oreg Grape-Goldenseal (BERBERINE COMPLEX PO) Take 1 capsule by mouth daily.   Turmeric 500 MG CAPS Take 1 capsule by mouth in the morning and at bedtime.   [DISCONTINUED] Cholecalciferol (VITAMIN D3) 1.25 MG (50000 UT) CAPS Take 2 capsules by mouth daily.   [DISCONTINUED] NP THYROID 60 MG tablet Take 1 tablet (60 mg total) by mouth daily before breakfast.   No facility-administered encounter medications on file as of 08/22/2021.   ALLERGIES: Allergies  Allergen Reactions   Erythromycin     edema Other reaction(s): Other (See Comments) Other Reaction: upset stomach   Prednisone Swelling    Edema depressed Other reaction(s): Other (See Comments) Other Reaction: depressed  VACCINATION STATUS: Immunization History  Administered Date(s) Administered   Influenza, Seasonal, Injecte, Preservative Fre 08/16/2014   Influenza-Unspecified 11/03/2012, 08/20/2019, 08/23/2020   Moderna Sars-Covid-2 Vaccination 02/17/2020, 03/20/2020, 08/21/2020    HPI Kari Hood is 40 y.o. female who presents today with a medical history as above. she is being seen in consultation for hypothyroidism, dyslipidemia requested by Janeece Agee, NP.   History is obtained directly from the patient as well as chart review. She was diagnosed with hypothyroidism in 2007 at approximate age of 70.  She has family history of hypothyroidism.  She denies family history of thyroid malignancy.  She was given treatment using various forms of thyroid hormone.  She is currently on NP thyroid 60 mg p.o. daily.  She  reports reasonable consistency and compliance with her medication.   Several thyroid function tests are favorable in the EMR.  She however describes fluctuating body weight, fatigue, anxiety, mild tremors on and off. She denies dysphagia, shortness of breath, nor voice change. She also has dyslipidemia with significant LDL, not on treatment. 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  Review of Systems  Constitutional: + Fluctuating body weight, unable to lose recently , +fatigue, no subjective hyperthermia, + slightly anxious state of mind  Eyes: no blurry vision, no xerophthalmia ENT: no sore throat, no nodules palpated in throat, no dysphagia/odynophagia, no hoarseness Cardiovascular: no Chest Pain, no Shortness of Breath, no palpitations, no leg swelling Respiratory: no cough, no shortness of breath Gastrointestinal: no Nausea/Vomiting/Diarhhea Musculoskeletal: no muscle/joint aches Skin: no rashes Neurological: + tremors, no numbness, no tingling, no dizziness Psychiatric: no depression, no anxiety  Objective:    Vitals with BMI 08/22/2021 08/10/2021 06/11/2021  Height 5\' 4"  5\' 4"  5\' 4"   Weight 173 lbs 3 oz 170 lbs 165 lbs 10 oz  BMI 29.72 29.17 28.41  Systolic 126 122  Diastolic 72 80 74  Pulse 72 76 73    BP 126/72   Pulse 72   Ht 5\' 4"  (1.626 m)   Wt 173 lb 3.2 oz (78.6 kg)   LMP 07/26/2021   BMI 29.73 kg/m   Wt Readings from Last 3 Encounters:  08/22/21 173 lb 3.2 oz (78.6 kg)  08/10/21 170 lb (77.1 kg)  06/11/21 165 lb 9.6 oz (75.1 kg)    Physical Exam  Constitutional:  Body mass index is 29.73 kg/m.,  not in acute distress, normal state of mind Eyes: PERRLA, EOMI, no exophthalmos ENT: moist mucous membranes, no gross thyromegaly, no gross cervical lymphadenopathy Cardiovascular: normal precordial activity, Regular Rate and Rhythm, no Murmur/Rubs/Gallops Respiratory:  adequate breathing efforts, no gross chest deformity, Clear  to auscultation bilaterally Gastrointestinal: abdomen soft, Non -tender, No distension, Bowel Sounds present, no gross organomegaly Musculoskeletal: no gross deformities, strength intact in all four extremities Skin: moist, warm, no rashes Neurological: + slight tremor with outstretched hands, Deep tendon reflexes normal in bilateral lower extremities.  CMP ( most recent) CMP     Component Value Date/Time   NA 139 08/10/2021 1524   K 3.9 08/10/2021 1524   CL 101 08/10/2021 1524   CO2 25 08/10/2021 1524   GLUCOSE 69 08/10/2021 1524   BUN 10 08/10/2021 1524   CREATININE 0.65 08/10/2021 1524   CALCIUM 9.7 08/10/2021 1524   PROT 7.8 08/10/2021 1524   ALBUMIN 4.6 10/30/2018 0849   AST 15 08/10/2021 1524   ALT 12 08/10/2021 1524   ALKPHOS 40 10/30/2018 0849  BILITOT 0.3 08/10/2021 1524   GFRNONAA 113 04/24/2021 1431   GFRAA 131 04/24/2021 1431     Diabetic Labs (most recent): Lab Results  Component Value Date   HGBA1C 5.0 08/10/2021   HGBA1C 5.5 04/28/2020   HGBA1C 5.3 10/30/2018     Lipid Panel ( most recent) Lipid Panel     Component Value Date/Time   CHOL 210 (H) 08/10/2021 1524   TRIG 133 08/10/2021 1524   HDL 54 08/10/2021 1524   CHOLHDL 3.9 08/10/2021 1524   VLDL 18.4 04/28/2020 0920   LDLCALC 131 (H) 08/10/2021 1524      Lab Results  Component Value Date   TSH 1.22 08/10/2021   TSH 0.88 04/24/2021   TSH 2.31 02/08/2021   TSH 2.25 04/28/2020   TSH 2.85 10/30/2018   FREET4 1.3 02/08/2021   FREET4 0.68 10/30/2018           Assessment & Plan:   1. Hypothyroidism 2 hyperlipidemia  3 .  Vitamin D deficiency   - Reyah Shalane Florendo  is being seen at a kind request of Janeece Agee, NP. - I have reviewed her available thyroid records and clinically evaluated the patient. - Based on these reviews, she has hypothyroidism, dyslipidemia. -I had a long discussion about her options of treating hypothyroidism.  She will be better off with Synthroid instead  of NP thyroid and she is in agreement.  She is advised to discontinue NP thyroid.  I discussed and prescribed Synthroid 50 mcg p.o. daily before breakfast.   - We discussed about the correct intake of her thyroid hormone, on empty stomach at fasting, with water, separated by at least 30 minutes from breakfast and other medications,  and separated by more than 4 hours from calcium, iron, multivitamins, acid reflux medications (PPIs). -Patient is made aware of the fact that thyroid hormone replacement is needed for life, dose to be adjusted by periodic monitoring of thyroid function tests.  Her next labs will include TPO and thyroglobulin antibodies to characterize the etiology of her hypothyroidism.   She does not have clinical goiter, no need for thyroid imaging at this time.  Regarding her dyslipidemia: She does not want to be on statins. She does have family history of similar dyslipidemia in her mother.  Low-dose statin medications may be unavoidable at one point.  In the meantime, she is advised to switch to plant predominant Whole [employer] lifestyle nutrition.  She is advised on optimal exercise.  Vitamin  D deficiency: She is replete at 70.  She is advised to continue vitamin D3 5000 units daily.  - she is advised to maintain close follow up with Janeece Agee, NP for primary care needs.   - Time spent with the patient: 60 minutes, of which >50% was spent in  counseling her about her hypothyroidism, hyperlipidemia, vitamin D deficiency and the rest in obtaining information about her symptoms, reviewing her previous labs/studies ( including abstractions from other facilities),  evaluations, and treatments,  and developing a plan to confirm diagnosis and long term treatment based on the latest standards of care/guidelines; and documenting her care.  Kari Hood participated in the discussions, expressed understanding, and voiced agreement with the above plans.  All questions were answered  to her satisfaction. she is encouraged to contact clinic should she have any questions or concerns prior to her return visit.  Follow up plan: Return in about 7 weeks (around 10/10/2021) for F/U with Pre-visit Labs.   Marquis Lunch, MD  [facility] [address] [place], [place] [address] Phone: [phone]  Fax: [phone]     08/23/2021, 11:43 AM  This note was partially dictated with voice recognition software. Similar sounding words can be transcribed inadequately or may not  be corrected upon review.

## 2021-08-23 ENCOUNTER — Encounter: Payer: Self-pay | Admitting: Registered Nurse

## 2021-08-23 DIAGNOSIS — E039 Hypothyroidism, unspecified: Secondary | ICD-10-CM

## 2021-08-24 NOTE — Telephone Encounter (Signed)
Pt and mom is asking it this could be done asap, pt is very worried.

## 2021-10-16 ENCOUNTER — Ambulatory Visit: Payer: BC Managed Care – PPO | Admitting: "Endocrinology

## 2021-11-23 ENCOUNTER — Other Ambulatory Visit: Payer: Self-pay

## 2021-11-23 ENCOUNTER — Encounter: Payer: Self-pay | Admitting: Registered Nurse

## 2021-11-23 ENCOUNTER — Ambulatory Visit: Payer: BC Managed Care – PPO | Admitting: Registered Nurse

## 2021-11-23 VITALS — BP 119/70 | HR 88 | Temp 98.0°F | Resp 18 | Ht 64.0 in | Wt 168.0 lb

## 2021-11-23 DIAGNOSIS — J358 Other chronic diseases of tonsils and adenoids: Secondary | ICD-10-CM

## 2021-11-23 LAB — POCT RAPID STREP A (OFFICE): Rapid Strep A Screen: NEGATIVE

## 2021-11-23 MED ORDER — AMOXICILLIN 500 MG PO TABS
500.0000 mg | ORAL_TABLET | Freq: Two times a day (BID) | ORAL | 0 refills | Status: DC
Start: 2021-11-23 — End: 2022-12-31

## 2021-11-23 NOTE — Progress Notes (Signed)
Established Patient Office Visit  Subjective:  Patient ID: Kari Hood, female    DOB: Jun 28, 1981  Age: 41 y.o. MRN: [id number]  CC:  Chief Complaint  Patient presents with   Sore Throat    Patient states she is here because she has been having a sore throat for 3 days and also some white patches.pt has been taking OTC medication     HPI Kari Hood presents for sore throat  Onset 3 days ago Stable Question of slight fever No other symptoms  Notes white patches on tonsils/back of throat.   Past Medical History:  Diagnosis Date   GERD (gastroesophageal reflux disease)    History of scleroderma    followed at [facility]   Hyperlipidemia    Hypothyroidism 2007   Hypothyroidism, adult 02/08/2021   Migraine     Past Surgical History:  Procedure Laterality Date   TONSILLECTOMY      Family History  Problem Relation Age of Onset   Thyroid disease Mother    Hyperlipidemia Mother    Hypertension Father    Diabetes Maternal Grandmother    Heart disease Maternal Grandmother    Hyperlipidemia Maternal Grandmother    Hypertension Maternal Grandmother    Cancer Paternal Grandmother    Early death Paternal Grandmother    Cervical cancer Paternal Grandmother    Depression Paternal Grandfather    Colon cancer Neg Hx    Esophageal cancer Neg Hx    Rectal cancer Neg Hx     Social History   Socioeconomic History   Marital status: Married    Spouse name: Webb SilversmithJoshua Osborne   Number of children: 0   Years of education: Not on file   Highest education level: Not on file  Occupational History   Occupation: professor at [employer]  Tobacco Use   Smoking status: Never   Smokeless tobacco: Never  Vaping Use   Vaping Use: Never used  Substance and Sexual Activity   Alcohol use: Never   Drug use: Never   Sexual activity: Yes  Other Topics Concern   Not on file  Social History Narrative   Married for 3 years.   Lives with husband.    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   Husband is Paramedicystems administrator at [employer].   Social Determinants of Health   Financial Resource Strain: Not on file  Food Insecurity: Not on file  Transportation Needs: Not on file  Physical Activity: Not on file  Stress: Not on file  Social Connections: Not on file  Intimate Partner Violence: Not on file    Outpatient Medications Prior to Visit  Medication Sig Dispense Refill   Aspirin-Acetaminophen-Caffeine (EXCEDRIN MIGRAINE PO) Take by mouth. As directed.     B Complex-C (SUPER B COMPLEX PO) Take 1 tablet by mouth daily.     Barberry-Oreg Grape-Goldenseal (BERBERINE COMPLEX PO) Take 1 capsule by mouth daily.     Cholecalciferol (DIALYVITE VITAMIN D 5000 PO) Take 5,000 Units by mouth daily with breakfast.     SYNTHROID 50 MCG tablet Take 1 tablet (50 mcg total) by mouth daily before breakfast. 30 tablet 2   Turmeric 500 MG CAPS Take 1 capsule by mouth in the morning and at bedtime.     No facility-administered medications prior to visit.    Allergies  Allergen Reactions   Erythromycin     edema Other reaction(s): Other (See Comments) Other Reaction: upset stomach   Prednisone Swelling    Edema depressed  Other reaction(s): Other (See Comments) Other Reaction: depressed    ROS Review of Systems  Constitutional: Negative.   HENT: Negative.    Eyes: Negative.   Respiratory: Negative.    Cardiovascular: Negative.   Gastrointestinal: Negative.   Genitourinary: Negative.   Musculoskeletal: Negative.   Skin: Negative.   Neurological: Negative.   Psychiatric/Behavioral: Negative.    All other systems reviewed and are negative.    Objective:    Physical Exam Vitals and nursing note reviewed.  Constitutional:      General: She is not in acute distress.    Appearance: Normal appearance. She is normal weight. She is not ill-appearing, toxic-appearing or diaphoretic.  HENT:     Mouth/Throat:     Mouth: Mucous membranes are  moist.     Pharynx: Uvula midline. Oropharyngeal exudate and posterior oropharyngeal erythema present.     Tonsils: Tonsillar exudate present. No tonsillar abscesses. 2+ on the right. 2+ on the left.  Cardiovascular:     Rate and Rhythm: Normal rate and regular rhythm.     Heart sounds: Normal heart sounds. No murmur heard.   No friction rub. No gallop.  Pulmonary:     Effort: Pulmonary effort is normal. No respiratory distress.     Breath sounds: Normal breath sounds. No stridor. No wheezing, rhonchi or rales.  Chest:     Chest wall: No tenderness.  Skin:    General: Skin is warm and dry.  Neurological:     General: No focal deficit present.     Mental Status: She is alert and oriented to person, place, and time. Mental status is at baseline.  Psychiatric:        Mood and Affect: Mood normal.        Behavior: Behavior normal.        Thought Content: Thought content normal.        Judgment: Judgment normal.    BP 119/70    Pulse 88    Temp 98 F (36.7 C) (Temporal)    Resp 18    Ht 5\' 4"  (1.626 m)    Wt 168 lb (76.2 kg)    SpO2 99%    BMI 28.84 kg/m  Wt Readings from Last 3 Encounters:  11/23/21 168 lb (76.2 kg)  08/22/21 173 lb 3.2 oz (78.6 kg)  08/10/21 170 lb (77.1 kg)     Health Maintenance Due  Topic Date Due   TETANUS/TDAP  Never done   COVID-19 Vaccine (4 - Booster for Moderna series) 10/16/2020    There are no preventive care reminders to display for this patient.  Lab Results  Component Value Date   TSH 1.22 08/10/2021   Lab Results  Component Value Date   WBC 7.8 08/10/2021   HGB 15.4 08/10/2021   HCT 44.6 08/10/2021   MCV 90.3 08/10/2021   PLT 356 08/10/2021   Lab Results  Component Value Date   NA 139 08/10/2021   K 3.9 08/10/2021   CO2 25 08/10/2021   GLUCOSE 69 08/10/2021   BUN 10 08/10/2021   CREATININE 0.65 08/10/2021   BILITOT 0.3 08/10/2021   ALKPHOS 40 10/30/2018   AST 15 08/10/2021   ALT 12 08/10/2021   PROT 7.8 08/10/2021    ALBUMIN 4.6 10/30/2018   CALCIUM 9.7 08/10/2021   GFR 102.67 04/28/2020   Lab Results  Component Value Date   CHOL 210 (H) 08/10/2021   Lab Results  Component Value Date   HDL 54 08/10/2021  Lab Results  Component Value Date   LDLCALC 131 (H) 08/10/2021   Lab Results  Component Value Date   TRIG 133 08/10/2021   Lab Results  Component Value Date   CHOLHDL 3.9 08/10/2021   Lab Results  Component Value Date   HGBA1C 5.0 08/10/2021      Assessment & Plan:   Problem List Items Addressed This Visit   None Visit Diagnoses     Tonsillar exudate    -  Primary   Relevant Medications   amoxicillin (AMOXIL) 500 MG tablet   Other Relevant Orders   POCT rapid strep A       Meds ordered this encounter  Medications   amoxicillin (AMOXIL) 500 MG tablet    Sig: Take 1 tablet (500 mg total) by mouth 2 (two) times daily.    Dispense:  20 tablet    Refill:  0    Order Specific Question:   Supervising Provider    Answer:   Neva Seat, JEFFREY R [[id number]]    Follow-up: Return if symptoms worsen or fail to improve.   PLAN Given tonsillar exudate and sore throat, will treat as strep pharyngitis.  Supportive care reviewed Return if worsening or failing to improve. Patient encouraged to call clinic with any questions, comments, or concerns.  Janeece Agee, NP

## 2021-11-23 NOTE — Patient Instructions (Addendum)
Kari Hood -  Kari Hood to see you! Sorry this is going on.  Despite the test, I am going to call this strep as I see it and treat with amoxicillin 500mg  twice daily for 10 days.  Ok to use tylenol 1000mg  three times daily for pain. Chloraseptic spray ok for pain relief.  Call on Monday if worsening or failing to improve  Thank you  [name]     If you have lab work done today you will be contacted with your lab results within the next 2 weeks.  If you have not heard from [place] then please contact us. The fastest way to get your results is to register for My Chart.   IF you received an x-ray today, you will receive an invoice from [facility]. Please contact [facility] at [phone] with questions or concerns regarding your invoice.   IF you received labwork today, you will receive an invoice from [facility]. Please contact [facility] at [phone] with questions or concerns regarding your invoice.   Our billing staff will not be able to assist you with questions regarding bills from these companies.  You will be contacted with the lab results as soon as they are available. The fastest way to get your results is to activate your My Chart account. Instructions are located on the last page of this paperwork. If you have not heard from [place] regarding the results in 2 weeks, please contact this office.

## 2021-12-12 ENCOUNTER — Encounter (INDEPENDENT_AMBULATORY_CARE_PROVIDER_SITE_OTHER): Payer: BC Managed Care – PPO | Admitting: Internal Medicine

## 2022-08-19 ENCOUNTER — Other Ambulatory Visit: Payer: Self-pay | Admitting: Obstetrics and Gynecology

## 2022-08-19 DIAGNOSIS — R928 Other abnormal and inconclusive findings on diagnostic imaging of breast: Secondary | ICD-10-CM

## 2022-08-27 ENCOUNTER — Ambulatory Visit: Payer: BC Managed Care – PPO

## 2022-08-27 ENCOUNTER — Ambulatory Visit
Admission: RE | Admit: 2022-08-27 | Discharge: 2022-08-27 | Disposition: A | Discharge status: Home / Self Care | Payer: BC Managed Care – PPO | Source: Ambulatory Visit | Attending: Obstetrics and Gynecology | Admitting: Obstetrics and Gynecology

## 2022-08-27 ENCOUNTER — Other Ambulatory Visit: Payer: Self-pay | Admitting: Obstetrics and Gynecology

## 2022-08-27 DIAGNOSIS — R928 Other abnormal and inconclusive findings on diagnostic imaging of breast: Secondary | ICD-10-CM

## 2022-08-27 DIAGNOSIS — R921 Mammographic calcification found on diagnostic imaging of breast: Secondary | ICD-10-CM

## 2022-09-11 ENCOUNTER — Ambulatory Visit
Admission: RE | Admit: 2022-09-11 | Discharge: 2022-09-11 | Disposition: A | Discharge status: Home / Self Care | Payer: BC Managed Care – PPO | Source: Ambulatory Visit | Attending: Obstetrics and Gynecology | Admitting: Obstetrics and Gynecology

## 2022-09-11 DIAGNOSIS — R921 Mammographic calcification found on diagnostic imaging of breast: Secondary | ICD-10-CM

## 2022-12-31 ENCOUNTER — Telehealth: Payer: BC Managed Care – PPO | Admitting: Physician Assistant

## 2022-12-31 ENCOUNTER — Telehealth: Payer: BC Managed Care – PPO

## 2022-12-31 DIAGNOSIS — U071 COVID-19: Secondary | ICD-10-CM

## 2022-12-31 MED ORDER — NIRMATRELVIR/RITONAVIR (PAXLOVID)TABLET: 3.0000 | ORAL_TABLET | Freq: Two times a day (BID) | ORAL | 0 refills | Status: AC

## 2022-12-31 NOTE — Patient Instructions (Signed)
Jen Mow, thank you for joining Leeanne Rio, PA-C for today's virtual visit.  While this provider is not your primary care provider (PCP), if your PCP is located in our provider database this encounter information will be shared with them immediately following your visit.   [facility] account gives you access to today's visit and all your visits, tests, and labs performed at [facility] " click here if you don't have a [facility] account or go to [email or web address]  Consent: (Patient) Jen Mow provided verbal consent for this virtual visit at the beginning of the encounter.  Current Medications:  Current Outpatient Medications:    amoxicillin (AMOXIL) 500 MG tablet, Take 1 tablet (500 mg total) by mouth 2 (two) times daily., Disp: 20 tablet, Rfl: 0   Aspirin-Acetaminophen-Caffeine (EXCEDRIN MIGRAINE PO), Take by mouth. As directed., Disp: , Rfl:    B Complex-C (SUPER B COMPLEX PO), Take 1 tablet by mouth daily., Disp: , Rfl:    Barberry-Oreg Grape-Goldenseal (BERBERINE COMPLEX PO), Take 1 capsule by mouth daily., Disp: , Rfl:    Cholecalciferol (DIALYVITE VITAMIN D 5000 PO), Take 5,000 Units by mouth daily with breakfast., Disp: , Rfl:    SYNTHROID 50 MCG tablet, Take 1 tablet (50 mcg total) by mouth daily before breakfast., Disp: 30 tablet, Rfl: 2   Turmeric 500 MG CAPS, Take 1 capsule by mouth in the morning and at bedtime., Disp: , Rfl:    Medications ordered in this encounter:  No orders of the defined types were placed in this encounter.    *If you need refills on other medications prior to your next appointment, please contact your pharmacy*  Follow-Up: Call back or seek an in-person evaluation if the symptoms worsen or if the condition fails to improve as anticipated.  [facility] [phone]  Other Instructions Please keep well-hydrated and get plenty of rest. Start a saline nasal rinse to flush  out your nasal passages. You can use plain Mucinex to help thin congestion. If you have a humidifier, running in the bedroom at night. I want you to start OTC vitamin D3 1000 units daily, vitamin C 1000 mg daily, and a zinc supplement. Please take prescribed medications as directed.  You have been enrolled in a MyChart symptom monitoring program. Please answer these questions daily so we can keep track of how you are doing.  You were to quarantine for 5 days from onset of your symptoms.  After day 5, if you have had no fever and you are feeling better, you can end quarantine but need to mask for an additional 5 days. After day 5 if you have a fever or are having significant symptoms, please quarantine for full 10 days.  If you note any worsening of symptoms, any significant shortness of breath or any chest pain, please seek ER evaluation ASAP.  Please do not delay care!  COVID-19: What to Do if You Are Sick If you test positive and are an older adult or someone who is at high risk of getting very sick from COVID-19, treatment may be available. Contact a healthcare provider right away after a positive test to determine if you are eligible, even if your symptoms are mild right now. You can also visit a Test to Treat location and, if eligible, receive a prescription from a provider. Don't delay: Treatment must be started within the first few days to be effective. If you have a fever, cough, or other  symptoms, you might have COVID-19. Most people have mild illness and are able to recover at home. If you are sick: Keep track of your symptoms. If you have an emergency warning sign (including trouble breathing), call 911. Steps to help prevent the spread of COVID-19 if you are sick If you are sick with COVID-19 or think you might have COVID-19, follow the steps below to care for yourself and to help protect other people in your home and community. Stay home except to get medical care Stay home. Most  people with COVID-19 have mild illness and can recover at home without medical care. Do not leave your home, except to get medical care. Do not visit public areas and do not go to places where you are unable to wear a mask. Take care of yourself. Get rest and stay hydrated. Take over-the-counter medicines, such as acetaminophen, to help you feel better. Stay in touch with your doctor. Call before you get medical care. Be sure to get care if you have trouble breathing, or have any other emergency warning signs, or if you think it is an emergency. Avoid public transportation, ride-sharing, or taxis if possible. Get tested If you have symptoms of COVID-19, get tested. While waiting for test results, stay away from others, including staying apart from those living in your household. Get tested as soon as possible after your symptoms start. Treatments may be available for people with COVID-19 who are at risk for becoming very sick. Don't delay: Treatment must be started early to be effective--some treatments must begin within 5 days of your first symptoms. Contact your healthcare provider right away if your test result is positive to determine if you are eligible. Self-tests are one of several options for testing for the virus that causes COVID-19 and may be more convenient than laboratory-based tests and point-of-care tests. Ask your healthcare provider or your local health department if you need help interpreting your test results. You can visit your state, tribal, local, and territorial health department's website to look for the latest local information on testing sites. Separate yourself from other people As much as possible, stay in a specific room and away from other people and pets in your home. If possible, you should use a separate bathroom. If you need to be around other people or animals in or outside of the home, wear a well-fitting mask. Tell your close contacts that they may have been exposed to  COVID-19. An infected person can spread COVID-19 starting 48 hours (or 2 days) before the person has any symptoms or tests positive. By letting your close contacts know they may have been exposed to COVID-19, you are helping to protect everyone. See COVID-19 and Animals if you have questions about pets. If you are diagnosed with COVID-19, someone from the health department may call you. Answer the call to slow the spread. Monitor your symptoms Symptoms of COVID-19 include fever, cough, or other symptoms. Follow care instructions from your healthcare provider and local health department. Your local health authorities may give instructions on checking your symptoms and reporting information. When to seek emergency medical attention Look for emergency warning signs* for COVID-19. If someone is showing any of these signs, seek emergency medical care immediately: Trouble breathing Persistent pain or pressure in the chest New confusion Inability to wake or stay awake Pale, gray, or blue-colored skin, lips, or nail beds, depending on skin tone *This list is not all possible symptoms. Please call your medical provider for any other  symptoms that are severe or concerning to you. Call 911 or call ahead to your local emergency facility: Notify the operator that you are seeking care for someone who has or may have COVID-19. Call ahead before visiting your doctor Call ahead. Many medical visits for routine care are being postponed or done by phone or telemedicine. If you have a medical appointment that cannot be postponed, call your doctor's office, and tell them you have or may have COVID-19. This will help the office protect themselves and other patients. If you are sick, wear a well-fitting mask You should wear a mask if you must be around other people or animals, including pets (even at home). Wear a mask with the best fit, protection, and comfort for you. You don't need to wear the mask if you are  alone. If you can't put on a mask (because of trouble breathing, for example), cover your coughs and sneezes in some other way. Try to stay at least 6 feet away from other people. This will help protect the people around you. Masks should not be placed on young children under age 47 years, anyone who has trouble breathing, or anyone who is not able to remove the mask without help. Cover your coughs and sneezes Cover your mouth and nose with a tissue when you cough or sneeze. Throw away used tissues in a lined trash can. Immediately wash your hands with soap and water for at least 20 seconds. If soap and water are not available, clean your hands with an alcohol-based hand sanitizer that contains at least 60% alcohol. Clean your hands often Wash your hands often with soap and water for at least 20 seconds. This is especially important after blowing your nose, coughing, or sneezing; going to the bathroom; and before eating or preparing food. Use hand sanitizer if soap and water are not available. Use an alcohol-based hand sanitizer with at least 60% alcohol, covering all surfaces of your hands and rubbing them together until they feel dry. Soap and water are the best option, especially if hands are visibly dirty. Avoid touching your eyes, nose, and mouth with unwashed hands. Handwashing Tips Avoid sharing personal household items Do not share dishes, drinking glasses, cups, eating utensils, towels, or bedding with other people in your home. Wash these items thoroughly after using them with soap and water or put in the dishwasher. Clean surfaces in your home regularly Clean and disinfect high-touch surfaces (for example, doorknobs, tables, handles, light switches, and countertops) in your "sick room" and bathroom. In shared spaces, you should clean and disinfect surfaces and items after each use by the person who is ill. If you are sick and cannot clean, a caregiver or other person should only clean and  disinfect the area around you (such as your bedroom and bathroom) on an as needed basis. Your caregiver/other person should wait as long as possible (at least several hours) and wear a mask before entering, cleaning, and disinfecting shared spaces that you use. Clean and disinfect areas that may have blood, stool, or body fluids on them. Use household cleaners and disinfectants. Clean visible dirty surfaces with household cleaners containing soap or detergent. Then, use a household disinfectant. Use a product from [employer] List N: Disinfectants for Coronavirus ([id number]). Be sure to follow the instructions on the label to ensure safe and effective use of the product. Many products recommend keeping the surface wet with a disinfectant for a certain period of time (look at "contact time" on the  product label). You may also need to wear personal protective equipment, such as gloves, depending on the directions on the product label. Immediately after disinfecting, wash your hands with soap and water for 20 seconds. For completed guidance on cleaning and disinfecting your home, visit Complete Disinfection Guidance. Take steps to improve ventilation at home Improve ventilation (air flow) at home to help prevent from spreading COVID-19 to other people in your household. Clear out COVID-19 virus particles in the air by opening windows, using air filters, and turning on fans in your home. Use this interactive tool to learn how to improve air flow in your home. When you can be around others after being sick with COVID-19 Deciding when you can be around others is different for different situations. Find out when you can safely end home isolation. For any additional questions about your care, contact your healthcare provider or state or local health department. 02/06/2021 Content source: [facility] ([facility]), Division of Viral Diseases This information is not intended  to replace advice given to you by your health care provider. Make sure you discuss any questions you have with your health care provider. Document Revised: 03/22/2021 Document Reviewed: 03/22/2021 Elsevier Patient Education  2022 [employer].   If you have been instructed to have an in-person evaluation today at a local Urgent Care facility, please use the link below. It will take you to a list of all of our available [facility], including address, phone number and hours of operation. Please do not delay care.  [facility]  If you or a family member do not have a primary care provider, use the link below to schedule a visit and establish care. When you choose a [facility] primary care physician or advanced practice provider, you gain a long-term partner in health. Find a Primary Care Provider  Learn more about 's in-office and virtual care options: [facility]

## 2022-12-31 NOTE — Addendum Note (Signed)
Addended by: Brunetta Jeans on: 12/31/2022 04:24 PM   Modules accepted: Orders

## 2022-12-31 NOTE — Progress Notes (Signed)
Virtual Visit Consent   Kari Hood, you are scheduled for a virtual visit with a [facility] provider today. Just as with appointments in the office, your consent must be obtained to participate. Your consent will be active for this visit and any virtual visit you may have with one of our providers in the next 365 days. If you have a MyChart account, a copy of this consent can be sent to you electronically.  As this is a virtual visit, video technology does not allow for your provider to perform a traditional examination. This may limit your provider's ability to fully assess your condition. If your provider identifies any concerns that need to be evaluated in person or the need to arrange testing (such as labs, EKG, etc.), we will make arrangements to do so. Although advances in technology are sophisticated, we cannot ensure that it will always work on either your end or our end. If the connection with a video visit is poor, the visit may have to be switched to a telephone visit. With either a video or telephone visit, we are not always able to ensure that we have a secure connection.  By engaging in this virtual visit, you consent to the provision of healthcare and authorize for your insurance to be billed (if applicable) for the services provided during this visit. Depending on your insurance coverage, you may receive a charge related to this service.  I need to obtain your verbal consent now. Are you willing to proceed with your visit today? Kari Hood has provided verbal consent on 12/31/2022 for a virtual visit (video or telephone). Leeanne Rio, [place]  Date: 12/31/2022 3:12 PM  Virtual Visit via Video Note   I, Leeanne Rio, connected with  Kari Hood  ([id number], 01-26-1981) on 12/31/22 at  3:30 PM EST by a video-enabled telemedicine application and verified that I am speaking with the correct person using two identifiers.  Location: Patient: Virtual Visit  Location Patient: Home Provider: Virtual Visit Location Provider: Home Office   I discussed the limitations of evaluation and management by telemedicine and the availability of in person appointments. The patient expressed understanding and agreed to proceed.    History of Present Illness: Kari Hood is a 42 y.o. who identifies as a female who was assigned female at birth, and is being seen today for COVID-19. Symptoms started early yesterday morning with some nasal drainage and feeling flushed. Came home -- symptoms continued to progress. Notes nasal congestion, sinus pressure, sore throat. Denies chest pain or SOB. Denies GI symptoms. Took two home COVID tests which were negative.   OTC -- Nothing yet.  HPI: HPI  Problems:  Patient Active Problem List   Diagnosis Date Noted   Mixed hyperlipidemia 08/22/2021   Vitamin D deficiency 08/22/2021   Hypothyroidism 02/08/2021    Allergies:  Allergies  Allergen Reactions   Erythromycin     edema Other reaction(s): Other (See Comments) Other Reaction: upset stomach   Prednisone Swelling    Edema depressed Other reaction(s): Other (See Comments) Other Reaction: depressed   Medications:  Current Outpatient Medications:    Aspirin-Acetaminophen-Caffeine (EXCEDRIN MIGRAINE PO), Take by mouth. As directed., Disp: , Rfl:    B Complex-C (SUPER B COMPLEX PO), Take 1 tablet by mouth daily., Disp: , Rfl:    Barberry-Oreg Grape-Goldenseal (BERBERINE COMPLEX PO), Take 1 capsule by mouth daily., Disp: , Rfl:    Cholecalciferol (DIALYVITE VITAMIN D 5000 PO), Take 5,000 Units by  mouth daily with breakfast., Disp: , Rfl:    levothyroxine (SYNTHROID) 75 MCG tablet, Take 75 mcg by mouth every morning., Disp: , Rfl:    liothyronine (CYTOMEL) 5 MCG tablet, Take 5 mcg by mouth daily., Disp: , Rfl:    Turmeric 500 MG CAPS, Take 1 capsule by mouth in the morning and at bedtime., Disp: , Rfl:   Observations/Objective: Patient is well-developed,  well-nourished in no acute distress.  Resting comfortably at home.  Head is normocephalic, atraumatic.  No labored breathing. Speech is clear and coherent with logical content.  Patient is alert and oriented at baseline.   Assessment and Plan: 1. COVID-19  Patient with multiple risk factors for complicated course of illness. Discussed risks/benefits of antiviral medications including most common potential ADRs. Patient voiced understanding and would like to proceed with antiviral medication. They are candidate for Paxlovid. Rx sent to pharmacy. Supportive measures, OTC medications and vitamin regimen reviewed. Patient has been enrolled in a MyChart COVID symptom monitoring program. Samule Dry reviewed in detail. Strict ER precautions discussed with patient.    Follow Up Instructions: I discussed the assessment and treatment plan with the patient. The patient was provided an opportunity to ask questions and all were answered. The patient agreed with the plan and demonstrated an understanding of the instructions.  A copy of instructions were sent to the patient via MyChart unless otherwise noted below.   The patient was advised to call back or seek an in-person evaluation if the symptoms worsen or if the condition fails to improve as anticipated.  Time:  I spent 10 minutes with the patient via telehealth technology discussing the above problems/concerns.    Leeanne Rio, PA-C

## 2023-01-14 ENCOUNTER — Other Ambulatory Visit: Payer: BC Managed Care – PPO

## 2023-01-14 DIAGNOSIS — E039 Hypothyroidism, unspecified: Secondary | ICD-10-CM

## 2023-01-14 DIAGNOSIS — E559 Vitamin D deficiency, unspecified: Secondary | ICD-10-CM

## 2023-01-14 DIAGNOSIS — E785 Hyperlipidemia, unspecified: Secondary | ICD-10-CM

## 2023-01-15 LAB — COMPLETE METABOLIC PANEL WITH GFR
AG Ratio: 1.6 (calc) (ref 1.0–2.5)
ALT: 16 U/L (ref 6–29)
AST: 17 U/L (ref 10–30)
Albumin: 4.6 g/dL (ref 3.6–5.1)
Alkaline phosphatase (APISO): 48 U/L (ref 31–125)
BUN/Creatinine Ratio: 10 (calc) (ref 6–22)
BUN: 6 mg/dL — ABNORMAL LOW (ref 7–25)
CO2: 26 mmol/L (ref 20–32)
Calcium: 9.6 mg/dL (ref 8.6–10.2)
Chloride: 103 mmol/L (ref 98–110)
Creat: 0.62 mg/dL (ref 0.50–0.97)
Globulin: 2.8 g/dL (calc) (ref 1.9–3.7)
Glucose, Bld: 83 mg/dL (ref 65–99)
Potassium: 4.3 mmol/L (ref 3.5–5.3)
Sodium: 138 mmol/L (ref 135–146)
Total Bilirubin: 0.5 mg/dL (ref 0.2–1.2)
Total Protein: 7.4 g/dL (ref 6.1–8.1)
eGFR: 116 mL/min/{1.73_m2} (ref >=60)

## 2023-01-15 LAB — LIPID PANEL
Cholesterol: 200 mg/dL — ABNORMAL HIGH (ref <200)
HDL: 50 mg/dL (ref >=50)
LDL Cholesterol (Calc): 129 mg/dL (calc) — ABNORMAL HIGH
Non-HDL Cholesterol (Calc): 150 mg/dL (calc) — ABNORMAL HIGH (ref <130)
Total CHOL/HDL Ratio: 4 (calc) (ref <5.0)
Triglycerides: 103 mg/dL (ref <150)

## 2023-01-15 LAB — CBC WITH DIFFERENTIAL/PLATELET
Absolute Monocytes: 473 cells/uL (ref 200–950)
Basophils Absolute: 63 cells/uL (ref 0–200)
Basophils Relative: 1 %
Eosinophils Absolute: 258 cells/uL (ref 15–500)
Eosinophils Relative: 4.1 %
HCT: 42.7 % (ref 35.0–45.0)
Hemoglobin: 14.4 g/dL (ref 11.7–15.5)
Lymphs Abs: 1663 cells/uL (ref 850–3900)
MCH: 30.1 pg (ref 27.0–33.0)
MCHC: 33.7 g/dL (ref 32.0–36.0)
MCV: 89.3 fL (ref 80.0–100.0)
MPV: 11.4 fL (ref 7.5–12.5)
Monocytes Relative: 7.5 %
Neutro Abs: 3843 cells/uL (ref 1500–7800)
Neutrophils Relative %: 61 %
Platelets: 304 10*3/uL (ref 140–400)
RBC: 4.78 10*6/uL (ref 3.80–5.10)
RDW: 12.3 % (ref 11.0–15.0)
Total Lymphocyte: 26.4 %
WBC: 6.3 10*3/uL (ref 3.8–10.8)

## 2023-01-15 LAB — VITAMIN D 25 HYDROXY (VIT D DEFICIENCY, FRACTURES): Vit D, 25-Hydroxy: 33 ng/mL (ref 30–100)

## 2023-01-15 LAB — TSH: TSH: 0.59 mIU/L

## 2023-01-21 ENCOUNTER — Encounter: Payer: Self-pay | Admitting: Family Medicine

## 2023-01-21 ENCOUNTER — Ambulatory Visit: Payer: BC Managed Care – PPO | Admitting: Family Medicine

## 2023-01-21 VITALS — BP 102/76 | HR 72 | Temp 98.6°F | Ht 64.0 in | Wt 168.0 lb

## 2023-01-21 DIAGNOSIS — Z0000 Encounter for general adult medical examination without abnormal findings: Secondary | ICD-10-CM | POA: Insufficient documentation

## 2023-01-21 DIAGNOSIS — Z0001 Encounter for general adult medical examination with abnormal findings: Secondary | ICD-10-CM | POA: Diagnosis not present

## 2023-01-21 DIAGNOSIS — E039 Hypothyroidism, unspecified: Secondary | ICD-10-CM | POA: Diagnosis not present

## 2023-01-21 DIAGNOSIS — E782 Mixed hyperlipidemia: Secondary | ICD-10-CM | POA: Diagnosis not present

## 2023-01-21 DIAGNOSIS — E079 Disorder of thyroid, unspecified: Secondary | ICD-10-CM | POA: Insufficient documentation

## 2023-01-21 DIAGNOSIS — Z23 Encounter for immunization: Secondary | ICD-10-CM | POA: Diagnosis not present

## 2023-01-21 DIAGNOSIS — M349 Systemic sclerosis, unspecified: Secondary | ICD-10-CM | POA: Insufficient documentation

## 2023-01-21 HISTORY — DX: Disorder of thyroid, unspecified: E07.9

## 2023-01-21 NOTE — Patient Instructions (Signed)
It was great to meet you today and I'm excited to have you join the [facility] practice. I hope you had a positive experience today! If you feel so inclined, please feel free to recommend our practice to friends and family. Mila Merry, FNP-C

## 2023-01-21 NOTE — Assessment & Plan Note (Signed)

## 2023-01-21 NOTE — Progress Notes (Signed)
New Patient Office Visit  Subjective    Patient ID: Kari Hood, female    DOB: March 17, 1981  Age: 42 y.o. MRN: [id number]  CC:  Chief Complaint  Patient presents with   Establish Care    HPI Kari Hood presents to establish care. Oriented to practice routines and expectations. 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 She has been working on weight loss with diet and exercise.   PAP 07/23/2022 abnormal AGUS/LGSIL with GYN (has dermoid cyst) s/p colposcopy Mammogram 08/20/2022 with negative biopsy Vaccines UTD STD declines BMI 28.84    Outpatient Encounter Medications as of 01/21/2023  Medication Sig   Aspirin-Acetaminophen-Caffeine (EXCEDRIN MIGRAINE PO) Take by mouth. As directed.   B Complex-C (SUPER B COMPLEX PO) Take 1 tablet by mouth daily.   Barberry-Oreg Grape-Goldenseal (BERBERINE COMPLEX PO) Take 1 capsule by mouth daily.   Cholecalciferol (DIALYVITE VITAMIN D 5000 PO) Take 5,000 Units by mouth daily with breakfast.   levothyroxine (SYNTHROID) 75 MCG tablet Take 75 mcg by mouth every morning.   liothyronine (CYTOMEL) 5 MCG tablet Take 5 mcg by mouth daily.   Turmeric 500 MG CAPS Take 1 capsule by mouth in the morning and at bedtime.   No facility-administered encounter medications on file as of 01/21/2023.    Past Medical History:  Diagnosis Date   Disorder of thyroid gland 01/21/2023   GERD (gastroesophageal reflux disease)    History of scleroderma    followed at [facility]   Hyperlipidemia    Hypothyroidism 2007   Hypothyroidism, adult 02/08/2021   Migraine     Past Surgical History:  Procedure Laterality Date   TONSILLECTOMY      Family History  Problem Relation Age of Onset   Thyroid disease Mother    Hyperlipidemia Mother    Hypertension Father    Diabetes Maternal Grandmother    Heart disease Maternal Grandmother    Hyperlipidemia Maternal Grandmother    Hypertension Maternal  Grandmother    Cancer Paternal Grandmother    Early death Paternal Grandmother    Cervical cancer Paternal Grandmother    Depression Paternal Grandfather    Colon cancer Neg Hx    Esophageal cancer Neg Hx    Rectal cancer Neg Hx     Social History   Socioeconomic History   Marital status: Married    Spouse name: Dimas Aguas   Number of children: 0   Years of education: Not on file   Highest education level: Not on file  Occupational History   Occupation: professor at [employer]  Tobacco Use   Smoking status: Never   Smokeless tobacco: Never  Vaping Use   Vaping Use: Never used  Substance and Sexual Activity   Alcohol use: Never   Drug use: Never   Sexual activity: Yes  Other Topics Concern   Not on file  Social History Narrative   Married for 3 years.   Lives with husband.   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   Husband is Publishing copy at [employer].   Social Determinants of Health   Financial Resource Strain: Not on file  Food Insecurity: Not on file  Transportation Needs: Not on file  Physical Activity: Not on file  Stress: Not on file  Social Connections: Not on file  Intimate Partner Violence: Not on file    Review of Systems  Constitutional: Negative.   HENT: Negative.    Eyes: Negative.   Respiratory: Negative.  Cardiovascular: Negative.   Gastrointestinal: Negative.   Genitourinary: Negative.   Musculoskeletal: Negative.   Skin: Negative.   Neurological: Negative.   Endo/Heme/Allergies: Negative.   Psychiatric/Behavioral: Negative.    All other systems reviewed and are negative.       Objective    BP 102/76   Pulse 72   Temp 98.6 F (37 C) (Temporal) Comment (Src): per pt feels ok, pt been sitting the car and it was hot  Ht '5\' 4"'$  (1.626 m)   Wt 168 lb (76.2 kg)   LMP 12/28/2022   SpO2 99%   BMI 28.84 kg/m   Physical Exam Vitals and nursing note reviewed.  Constitutional:      Appearance: Normal  appearance. She is normal weight.  HENT:     Head: Normocephalic and atraumatic.     Right Ear: Tympanic membrane, ear canal and external ear normal.     Left Ear: Tympanic membrane, ear canal and external ear normal.     Nose: Nose normal.     Mouth/Throat:     Mouth: Mucous membranes are moist.     Pharynx: Oropharynx is clear.  Eyes:     Extraocular Movements: Extraocular movements intact.     Conjunctiva/sclera: Conjunctivae normal.     Pupils: Pupils are equal, round, and reactive to light.  Cardiovascular:     Rate and Rhythm: Normal rate and regular rhythm.     Pulses: Normal pulses.     Heart sounds: Normal heart sounds.  Pulmonary:     Effort: Pulmonary effort is normal.     Breath sounds: Normal breath sounds.  Abdominal:     General: Bowel sounds are normal.     Palpations: Abdomen is soft.  Musculoskeletal:        General: Normal range of motion.     Cervical back: Normal range of motion and neck supple.  Skin:    General: Skin is warm and dry.     Capillary Refill: Capillary refill takes less than 2 seconds.  Neurological:     General: No focal deficit present.     Mental Status: She is alert and oriented to person, place, and time. Mental status is at baseline.  Psychiatric:        Mood and Affect: Mood normal.        Behavior: Behavior normal.        Thought Content: Thought content normal.        Judgment: Judgment normal.         Assessment & Plan:   Problem List Items Addressed This Visit       Endocrine   Hypothyroidism    Well controlled on synthroid 35mg daily and Cytomel 557m daily.         Other   Mixed hyperlipidemia    Currently working on diet and exercise since December. Has lost 12 pounds. Eats mostly plant based. Provided with resources on heart healthy diet and encourage 150 minutes moderate intensity exercise weekly. Will recheck in 6 months.      Physical exam, annual - Primary    Today your medical history was reviewed and  routine physical exam with labs was performed. Recommend 150 minutes of moderate intensity exercise weekly and consuming a well-balanced diet. Advised to stop smoking if a smoker, avoid smoking if a non-smoker, limit alcohol consumption to 1 drink per day for women and 2 drinks per day for men, and avoid illicit drug use. Counseled on safe sex practices and  offered STI testing today. Counseled on the importance of sunscreen use. Counseled in mental health awareness and when to seek medical care. Vaccine maintenance discussed. Appropriate health maintenance items reviewed. Return to office in 1 year for annual physical exam.      Scleroderma ([place])    Return in about 6 months (around 07/24/2023) for cholesterol recheck.   Rubie Maid, FNP

## 2023-01-21 NOTE — Assessment & Plan Note (Signed)
Currently working on diet and exercise since December. Has lost 12 pounds. Eats mostly plant based. Provided with resources on heart healthy diet and encourage 150 minutes moderate intensity exercise weekly. Will recheck in 6 months.

## 2023-01-21 NOTE — Assessment & Plan Note (Signed)
Well controlled on synthroid 7mg daily and Cytomel 589m daily.

## 2023-02-11 IMAGING — MR MR LUMBAR SPINE W/O CM
5 series · 31 of 48 positions shown · non-contrast
Comparison: None.

CLINICAL DATA: Lumbar radiculopathy.

EXAM:
MRI LUMBAR SPINE WITHOUT CONTRAST
TECHNIQUE: Multiplanar, multisequence MR imaging of the lumbar spine was
performed. No intravenous contrast was administered.

[Series 5: T1 · sagittal · 4.0mm · 0.81mm/px · 6 of 14 slices shown (1 of 2)]
[im 1/14]
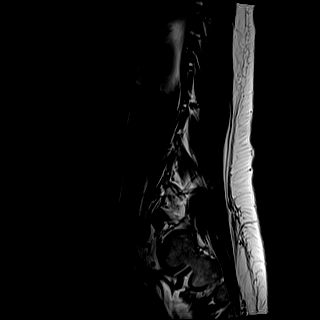
[im 3/14]
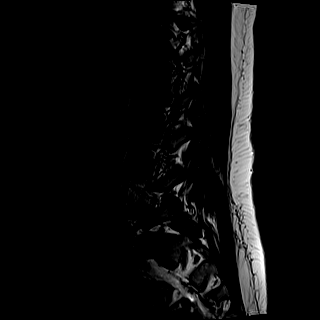
[im 6/14]
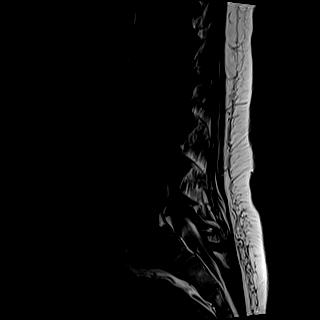
[im 8/14]
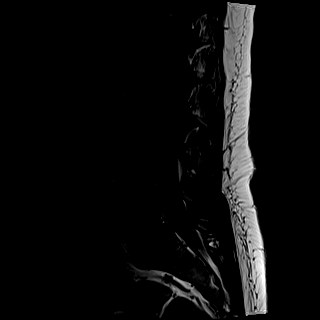
[im 11/14]
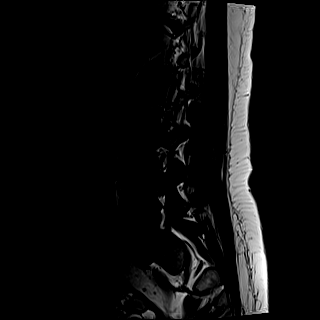
[im 14/14]
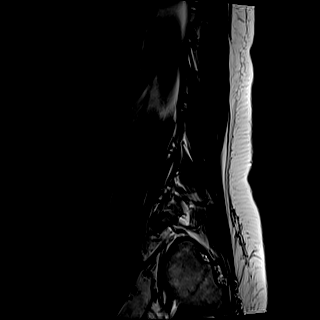

[Series 6: T2 · sagittal · 4.0mm · 0.81mm/px · 6 of 14 slices shown (1 of 2)]
[im 1/14]
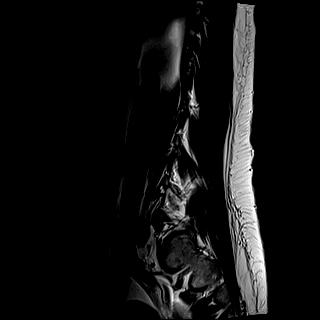
[im 3/14]
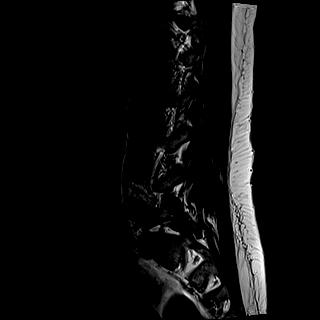
[im 6/14]
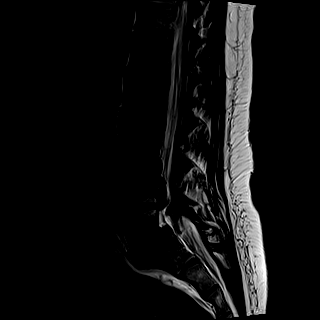
[im 8/14]
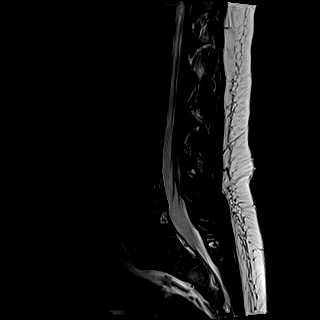
[im 11/14]
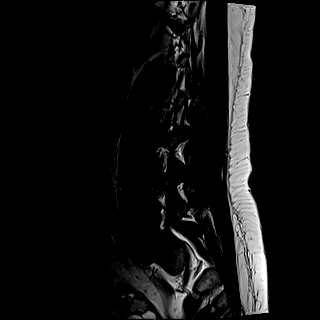
[im 14/14]
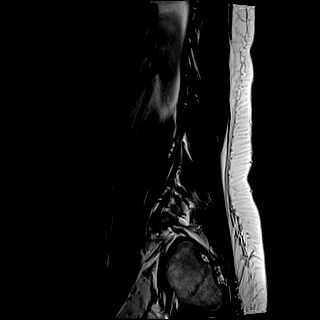

[Series 7: STIR · sagittal · 4.0mm · 0.51mm/px · 1 of 14 slices shown]
[im 1/14]
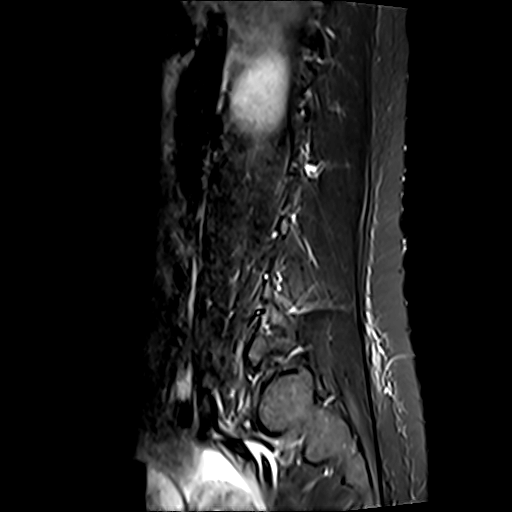

[Series 8: T2 · axial · 4.0mm · 0.62mm/px · z∈[-122,+83]mm · 9 of 38 slices shown (2 of 2)]
[im 1/38]
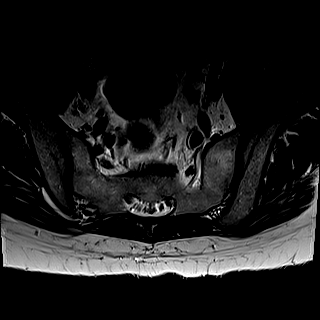
[im 6/38]
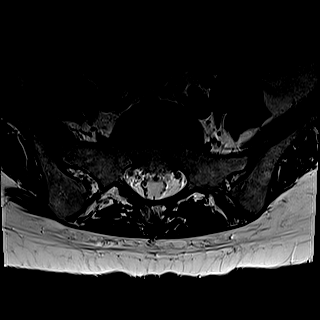
[im 11/38]
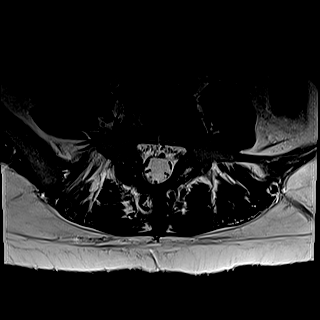
[im 16/38]
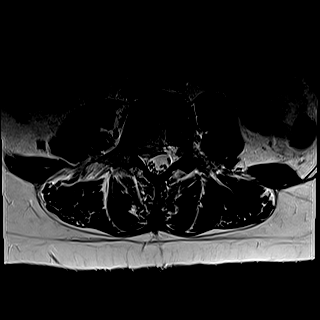
[im 19/38]
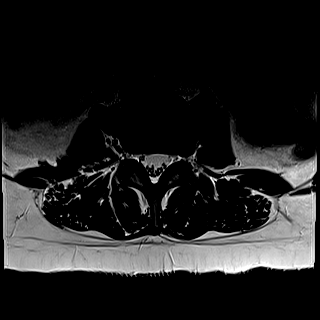
[im 22/38]
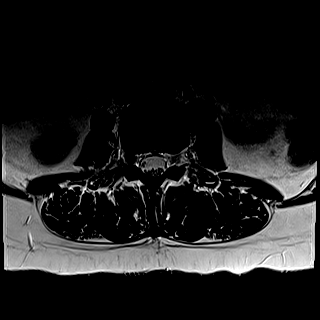
[im 27/38]
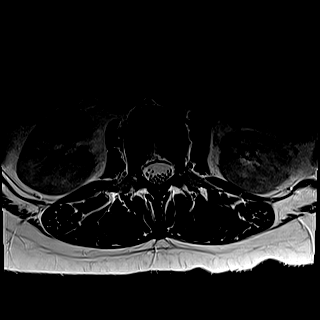
[im 32/38]
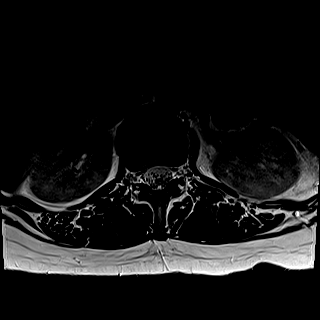
[im 38/38]
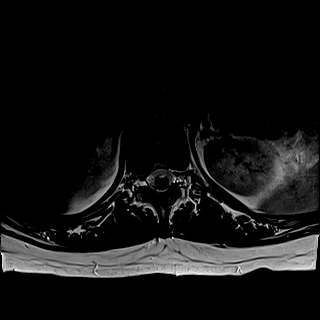

[Series 9: T1 · axial · 4.0mm · 0.39mm/px · z∈[-122,+83]mm · 9 of 38 slices shown (2 of 2)]
[im 1/38]
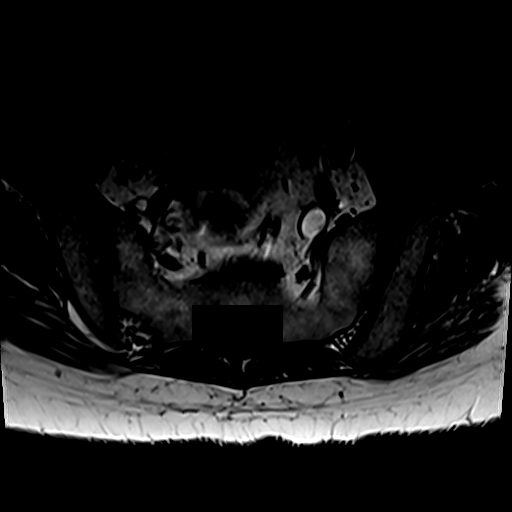
[im 6/38]
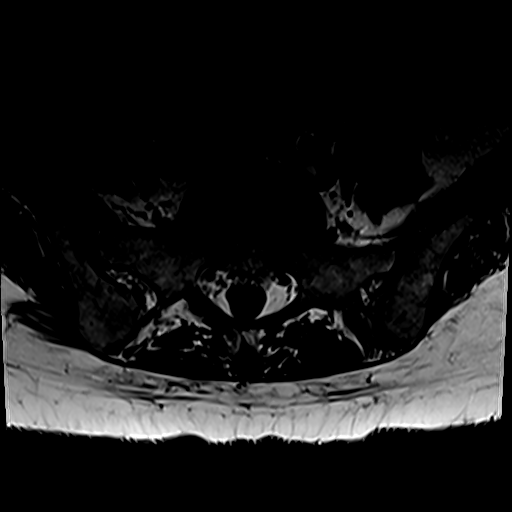
[im 11/38]
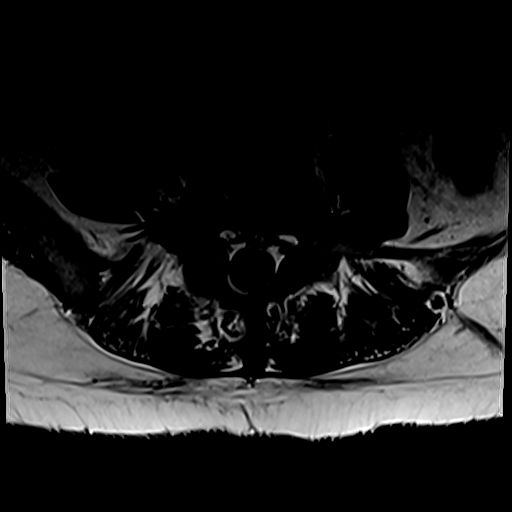
[im 16/38]
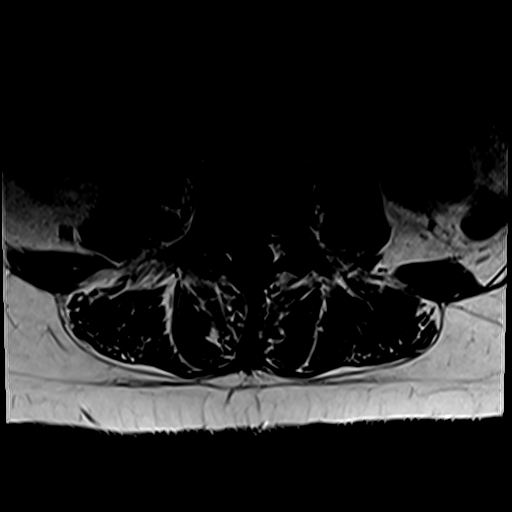
[im 19/38]
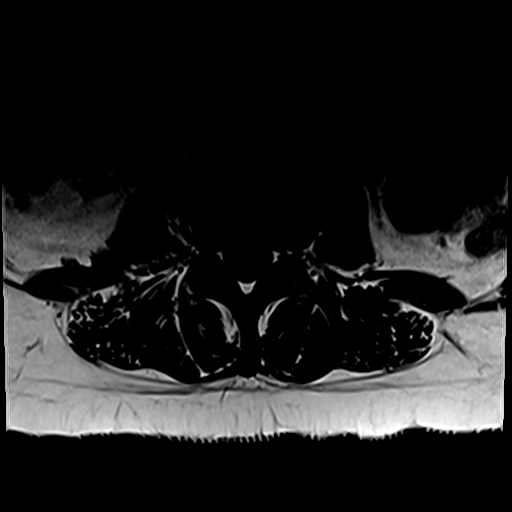
[im 22/38]
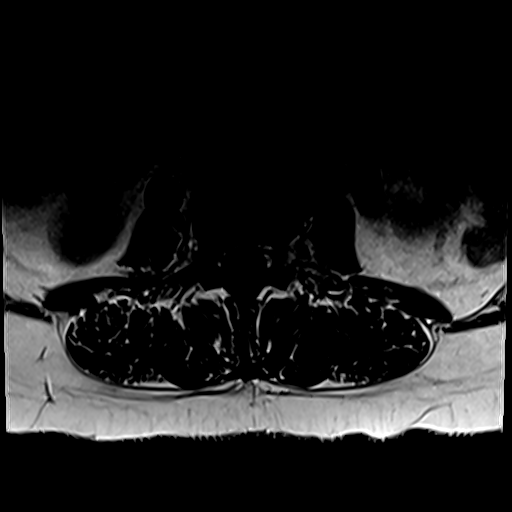
[im 27/38]
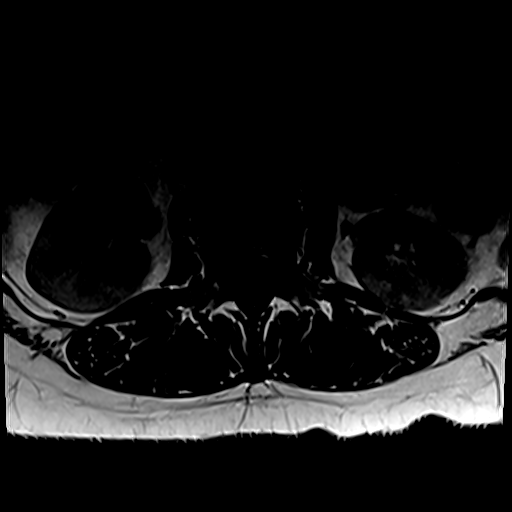
[im 32/38]
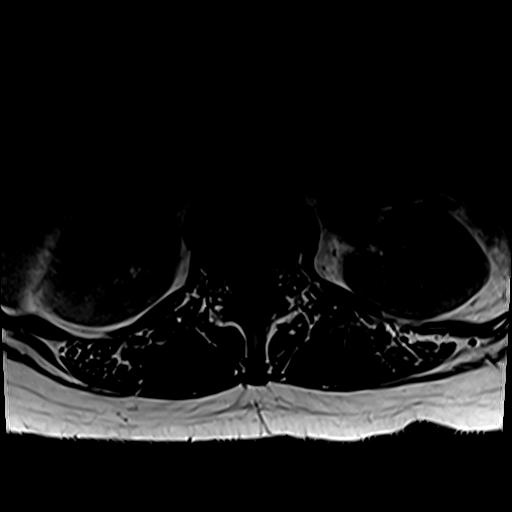
[im 38/38]
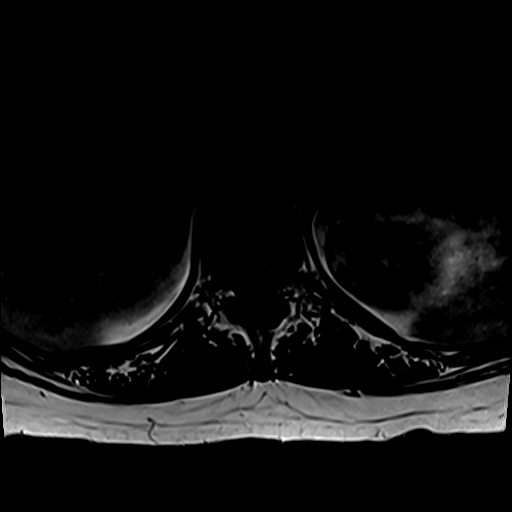

[31 of 48 positions shown; findings below may reference images not displayed]

FINDINGS: Segmentation:  Standard.

Alignment: Grade 1 anterolisthesis of L5 over S1 related to
bilateral L5 pars defect.

Vertebrae:  No acute fracture, evidence of discitis, or bone lesion.

Conus medullaris and cauda equina: Conus extends to the L2 level.
Conus and cauda equina appear normal.

Paraspinal and other soft tissues: Negative.

Disc levels:

T12-L1:No spinal canal or neural foraminal stenosis.

L1-2:No spinal canal or neural foraminal stenosis.

L2-3:No spinal canal or neural foraminal stenosis.

L3-4:Tiny left far lateral disc protrusion.No spinal canal or neural
foraminal stenosis.

L4-5:Loss of disc height, disc bulge with superimposed superiorly
migrating right central/subarticular disc extrusion causing
narrowing of the right subarticular zone impinging on the exiting
right L4 nerve root. Mild right neural foraminal narrowing. No
significant spinal canal stenosis.

L5-S1:Disc bulge/disc uncovering with superimposed central disc
protrusion with annular tear without significantspinal canal or
neural foraminal stenosis.
IMPRESSION: 1. Right central/subarticular superiorly migrating disc extrusion at
L4-5 impinging on the right L4 nerve root.
2. Grade 1 anterolisthesis at L5-S1 related to pars defect.

## 2023-06-23 ENCOUNTER — Other Ambulatory Visit: Payer: BC Managed Care – PPO

## 2023-06-23 DIAGNOSIS — E782 Mixed hyperlipidemia: Secondary | ICD-10-CM

## 2023-11-24 ENCOUNTER — Ambulatory Visit
Admission: RE | Admit: 2023-11-24 | Discharge: 2023-11-24 | Disposition: A | Discharge status: Home / Self Care | Payer: 59 | Source: Ambulatory Visit | Attending: Family Medicine | Admitting: Family Medicine

## 2023-11-24 VITALS — BP 120/79 | HR 68 | Temp 98.3°F | Resp 14

## 2023-11-24 DIAGNOSIS — M67449 Ganglion, unspecified hand: Secondary | ICD-10-CM | POA: Diagnosis not present

## 2023-11-24 NOTE — ED Provider Notes (Signed)
 [facility]    CSN: [id number] Arrival date & time: 11/24/23  1534      History   Chief Complaint Chief Complaint  Patient presents with   Cyst    HPI Kari Hood is a 43 y.o. female.   Patient presenting today with a hard knot to the joint of the right thumb that she first noticed several days ago.  She states pain is worse with movement of the thumb.  Denies injury to the area, deformity, numbness, tingling, discoloration, diffuse swelling.  So far trying ibuprofen with mild temporary benefit.    Past Medical History:  Diagnosis Date   Disorder of thyroid  gland 01/21/2023   GERD (gastroesophageal reflux disease)    History of scleroderma    followed at [facility]   Hyperlipidemia    Hypothyroidism 2007   Hypothyroidism, adult 02/08/2021   Migraine     Patient Active Problem List   Diagnosis Date Noted   Physical exam, annual 01/21/2023   Scleroderma ([facility]) 01/21/2023   Mixed hyperlipidemia 08/22/2021   Vitamin D  deficiency 08/22/2021   Hypothyroidism 02/08/2021    Past Surgical History:  Procedure Laterality Date   TONSILLECTOMY      OB History   No obstetric history on file.      Home Medications    Prior to Admission medications   Medication Sig Start Date End Date Taking? Authorizing Provider  B Complex-C (SUPER B COMPLEX PO) Take 1 tablet by mouth daily.   Yes [provider]  Barberry-Oreg Grape-Goldenseal (BERBERINE COMPLEX PO) Take 1 capsule by mouth daily.   Yes [provider]  Cholecalciferol (DIALYVITE VITAMIN D  5000 PO) Take 5,000 Units by mouth daily with breakfast.   Yes [provider]  levothyroxine  (SYNTHROID ) 75 MCG tablet Take 75 mcg by mouth every morning.   Yes [provider]  liothyronine (CYTOMEL) 5 MCG tablet Take 5 mcg by mouth daily.   Yes [provider]  Turmeric 500 MG CAPS Take 1 capsule by mouth in the morning and at bedtime.   Yes [provider]  Aspirin-Acetaminophen-Caffeine (EXCEDRIN MIGRAINE PO) Take by mouth. As directed.    [provider]    Family History Family History  Problem Relation Age of Onset   Thyroid  disease Mother    Hyperlipidemia Mother    Hypertension Father    Diabetes Maternal Grandmother    Heart disease Maternal Grandmother    Hyperlipidemia Maternal Grandmother    Hypertension Maternal Grandmother    Cancer Paternal Grandmother    Early death Paternal Grandmother    Cervical cancer Paternal Grandmother    Depression Paternal Grandfather    Colon cancer Neg Hx    Esophageal cancer Neg Hx    Rectal cancer Neg Hx     Social History Social History   Tobacco Use   Smoking status: Never   Smokeless tobacco: Never  Vaping Use   Vaping status: Never Used  Substance Use Topics   Alcohol use: Never   Drug use: Never     Allergies   Erythromycin and Prednisone    Review of Systems Review of Systems Per HPI  Physical Exam Triage Vital Signs ED Triage Vitals  Encounter Vitals Group     BP 11/24/23 1615 120/79     Systolic BP Percentile --      Diastolic BP Percentile --      Pulse Rate 11/24/23 1615 68     Resp 11/24/23 1615 14  Temp 11/24/23 1615 98.3 F (36.8 C)     Temp Source 11/24/23 1615 Oral     SpO2 11/24/23 1615 98 %     Weight --      Height --      Head Circumference --      Peak Flow --      Pain Score 11/24/23 1613 2     Pain Loc --      Pain Education --      Exclude from Growth Chart --    No data found.  Updated Vital Signs BP 120/79 (BP Location: Right Arm)   Pulse 68   Temp 98.3 F (36.8 C) (Oral)   Resp 14   LMP 11/18/2023 (Exact Date)   SpO2 98%   Visual Acuity Right Eye Distance:   Left Eye Distance:   Bilateral Distance:    Right Eye Near:   Left Eye Near:    Bilateral Near:     Physical Exam Vitals and nursing note reviewed.  Constitutional:      Appearance: Normal appearance. She is not ill-appearing.  HENT:      Head: Atraumatic.  Eyes:     Extraocular Movements: Extraocular movements intact.     Conjunctiva/sclera: Conjunctivae normal.  Cardiovascular:     Rate and Rhythm: Normal rate and regular rhythm.     Heart sounds: Normal heart sounds.  Pulmonary:     Effort: Pulmonary effort is normal.     Breath sounds: Normal breath sounds.  Musculoskeletal:        General: Tenderness present. Normal range of motion.     Cervical back: Normal range of motion and neck supple.     Comments: Firm palpable cyst to the joint of the right thumb.  Minimally tender to palpation  Skin:    General: Skin is warm and dry.  Neurological:     Mental Status: She is alert and oriented to person, place, and time.     Comments: Right hand neurovascularly intact  Psychiatric:        Mood and Affect: Mood normal.        Thought Content: Thought content normal.        Judgment: Judgment normal.      UC Treatments / Results  Labs (all labs ordered are listed, but only abnormal results are displayed) Labs Reviewed - No data to display  EKG   Radiology No results found.  Procedures Procedures (including critical care time)  Medications Ordered in UC Medications - No data to display  Initial Impression / Assessment and Plan / UC Course  I have reviewed the triage vital signs and the nursing notes.  Pertinent labs & imaging results that were available during my care of the patient were reviewed by me and considered in my medical decision making (see chart for details).     Consistent with ganglion cyst, discussed orthopedic follow-up, RICE protocol, over-the-counter pain relievers as needed.  Return for worsening symptoms.  X-ray imaging deferred with shared decision making today.  Final Clinical Impressions(s) / UC Diagnoses   Final diagnoses:  Ganglion cyst of finger   Discharge Instructions   None    ED Prescriptions   None    PDMP not reviewed this encounter.   Stuart Vernell Norris,  [place] 11/24/23 1944

## 2023-11-24 NOTE — ED Triage Notes (Signed)
 Pt presents with knot on right thumb. That started Saturday. Taking ibuprofen.

## 2024-11-30 ENCOUNTER — Other Ambulatory Visit (HOSPITAL_COMMUNITY): Payer: Self-pay | Admitting: *Deleted

## 2024-11-30 DIAGNOSIS — M349 Systemic sclerosis, unspecified: Secondary | ICD-10-CM

## 2024-11-30 NOTE — Progress Notes (Signed)
 Received new pt referral for scleroderma, pt will need echo and pft's prior to appt per protocol, orders placed will schedule
